# Patient Record
Sex: Male | Born: 1945
Health system: Southern US, Community
[De-identification: ages and names within clinical notes are randomized; demographics above are authoritative.]

## PROBLEM LIST (undated history)

## (undated) DIAGNOSIS — M51369 Other intervertebral disc degeneration, lumbar region without mention of lumbar back pain or lower extremity pain: Secondary | ICD-10-CM

## (undated) DIAGNOSIS — I2510 Atherosclerotic heart disease of native coronary artery without angina pectoris: Secondary | ICD-10-CM

## (undated) DIAGNOSIS — I4891 Unspecified atrial fibrillation: Secondary | ICD-10-CM

## (undated) DIAGNOSIS — L409 Psoriasis, unspecified: Secondary | ICD-10-CM

## (undated) DIAGNOSIS — M5136 Other intervertebral disc degeneration, lumbar region: Secondary | ICD-10-CM

## (undated) HISTORY — DX: Atherosclerotic heart disease of native coronary artery without angina pectoris: I25.10

## (undated) HISTORY — PX: CARDIAC CATHETERIZATION: SHX172

## (undated) HISTORY — DX: Other intervertebral disc degeneration, lumbar region without mention of lumbar back pain or lower extremity pain: M51.369

## (undated) HISTORY — DX: Other intervertebral disc degeneration, lumbar region: M51.36

## (undated) HISTORY — DX: Unspecified atrial fibrillation: I48.91

## (undated) HISTORY — DX: Psoriasis, unspecified: L40.9

---

## 2011-12-28 HISTORY — PX: CORONARY ARTERY BYPASS GRAFT: SHX141

## 2012-04-20 DIAGNOSIS — I2510 Atherosclerotic heart disease of native coronary artery without angina pectoris: Secondary | ICD-10-CM | POA: Diagnosis not present

## 2012-04-20 DIAGNOSIS — R7309 Other abnormal glucose: Secondary | ICD-10-CM | POA: Diagnosis not present

## 2012-04-20 DIAGNOSIS — Z87891 Personal history of nicotine dependence: Secondary | ICD-10-CM | POA: Diagnosis not present

## 2012-04-20 DIAGNOSIS — I200 Unstable angina: Secondary | ICD-10-CM | POA: Diagnosis not present

## 2012-04-20 DIAGNOSIS — R072 Precordial pain: Secondary | ICD-10-CM | POA: Diagnosis not present

## 2012-04-20 DIAGNOSIS — I2589 Other forms of chronic ischemic heart disease: Secondary | ICD-10-CM | POA: Diagnosis not present

## 2012-04-20 DIAGNOSIS — I214 Non-ST elevation (NSTEMI) myocardial infarction: Secondary | ICD-10-CM | POA: Diagnosis not present

## 2012-04-20 DIAGNOSIS — R079 Chest pain, unspecified: Secondary | ICD-10-CM | POA: Diagnosis not present

## 2012-04-21 DIAGNOSIS — I200 Unstable angina: Secondary | ICD-10-CM | POA: Diagnosis not present

## 2012-04-21 DIAGNOSIS — Z452 Encounter for adjustment and management of vascular access device: Secondary | ICD-10-CM | POA: Diagnosis not present

## 2012-04-21 DIAGNOSIS — I214 Non-ST elevation (NSTEMI) myocardial infarction: Secondary | ICD-10-CM | POA: Diagnosis not present

## 2012-04-21 DIAGNOSIS — Z888 Allergy status to other drugs, medicaments and biological substances status: Secondary | ICD-10-CM | POA: Diagnosis not present

## 2012-04-21 DIAGNOSIS — Z87891 Personal history of nicotine dependence: Secondary | ICD-10-CM | POA: Diagnosis not present

## 2012-04-21 DIAGNOSIS — I519 Heart disease, unspecified: Secondary | ICD-10-CM | POA: Diagnosis not present

## 2012-04-21 DIAGNOSIS — I4891 Unspecified atrial fibrillation: Secondary | ICD-10-CM | POA: Diagnosis not present

## 2012-04-21 DIAGNOSIS — I10 Essential (primary) hypertension: Secondary | ICD-10-CM | POA: Diagnosis not present

## 2012-04-21 DIAGNOSIS — Z01818 Encounter for other preprocedural examination: Secondary | ICD-10-CM | POA: Diagnosis not present

## 2012-04-21 DIAGNOSIS — R7309 Other abnormal glucose: Secondary | ICD-10-CM | POA: Diagnosis not present

## 2012-04-21 DIAGNOSIS — Z9889 Other specified postprocedural states: Secondary | ICD-10-CM | POA: Diagnosis not present

## 2012-04-21 DIAGNOSIS — Z885 Allergy status to narcotic agent status: Secondary | ICD-10-CM | POA: Diagnosis not present

## 2012-04-21 DIAGNOSIS — I2589 Other forms of chronic ischemic heart disease: Secondary | ICD-10-CM | POA: Diagnosis not present

## 2012-04-21 DIAGNOSIS — I2510 Atherosclerotic heart disease of native coronary artery without angina pectoris: Secondary | ICD-10-CM | POA: Diagnosis present

## 2012-04-21 DIAGNOSIS — R079 Chest pain, unspecified: Secondary | ICD-10-CM | POA: Diagnosis not present

## 2012-04-21 DIAGNOSIS — J90 Pleural effusion, not elsewhere classified: Secondary | ICD-10-CM | POA: Diagnosis not present

## 2012-04-21 DIAGNOSIS — E785 Hyperlipidemia, unspecified: Secondary | ICD-10-CM | POA: Diagnosis not present

## 2012-04-30 DIAGNOSIS — I10 Essential (primary) hypertension: Secondary | ICD-10-CM | POA: Diagnosis not present

## 2012-04-30 DIAGNOSIS — Z48812 Encounter for surgical aftercare following surgery on the circulatory system: Secondary | ICD-10-CM | POA: Diagnosis not present

## 2012-04-30 DIAGNOSIS — E785 Hyperlipidemia, unspecified: Secondary | ICD-10-CM | POA: Diagnosis not present

## 2012-05-03 DIAGNOSIS — Z48812 Encounter for surgical aftercare following surgery on the circulatory system: Secondary | ICD-10-CM | POA: Diagnosis not present

## 2012-05-03 DIAGNOSIS — E785 Hyperlipidemia, unspecified: Secondary | ICD-10-CM | POA: Diagnosis not present

## 2012-05-03 DIAGNOSIS — I10 Essential (primary) hypertension: Secondary | ICD-10-CM | POA: Diagnosis not present

## 2012-05-05 DIAGNOSIS — I10 Essential (primary) hypertension: Secondary | ICD-10-CM | POA: Diagnosis not present

## 2012-05-05 DIAGNOSIS — E785 Hyperlipidemia, unspecified: Secondary | ICD-10-CM | POA: Diagnosis not present

## 2012-05-05 DIAGNOSIS — Z48812 Encounter for surgical aftercare following surgery on the circulatory system: Secondary | ICD-10-CM | POA: Diagnosis not present

## 2012-05-08 DIAGNOSIS — I10 Essential (primary) hypertension: Secondary | ICD-10-CM | POA: Diagnosis not present

## 2012-05-08 DIAGNOSIS — Z48812 Encounter for surgical aftercare following surgery on the circulatory system: Secondary | ICD-10-CM | POA: Diagnosis not present

## 2012-05-08 DIAGNOSIS — E785 Hyperlipidemia, unspecified: Secondary | ICD-10-CM | POA: Diagnosis not present

## 2012-05-09 DIAGNOSIS — E785 Hyperlipidemia, unspecified: Secondary | ICD-10-CM | POA: Diagnosis not present

## 2012-05-09 DIAGNOSIS — Z48812 Encounter for surgical aftercare following surgery on the circulatory system: Secondary | ICD-10-CM | POA: Diagnosis not present

## 2012-05-09 DIAGNOSIS — I10 Essential (primary) hypertension: Secondary | ICD-10-CM | POA: Diagnosis not present

## 2012-05-12 DIAGNOSIS — I10 Essential (primary) hypertension: Secondary | ICD-10-CM | POA: Diagnosis not present

## 2012-05-12 DIAGNOSIS — Z48812 Encounter for surgical aftercare following surgery on the circulatory system: Secondary | ICD-10-CM | POA: Diagnosis not present

## 2012-05-12 DIAGNOSIS — E785 Hyperlipidemia, unspecified: Secondary | ICD-10-CM | POA: Diagnosis not present

## 2012-05-17 DIAGNOSIS — Z48812 Encounter for surgical aftercare following surgery on the circulatory system: Secondary | ICD-10-CM | POA: Diagnosis not present

## 2012-05-17 DIAGNOSIS — E785 Hyperlipidemia, unspecified: Secondary | ICD-10-CM | POA: Diagnosis not present

## 2012-05-17 DIAGNOSIS — I10 Essential (primary) hypertension: Secondary | ICD-10-CM | POA: Diagnosis not present

## 2012-05-25 DIAGNOSIS — Z951 Presence of aortocoronary bypass graft: Secondary | ICD-10-CM | POA: Insufficient documentation

## 2012-06-05 DIAGNOSIS — E785 Hyperlipidemia, unspecified: Secondary | ICD-10-CM | POA: Diagnosis not present

## 2012-06-05 DIAGNOSIS — I2510 Atherosclerotic heart disease of native coronary artery without angina pectoris: Secondary | ICD-10-CM | POA: Diagnosis not present

## 2012-07-14 DIAGNOSIS — R0789 Other chest pain: Secondary | ICD-10-CM | POA: Diagnosis not present

## 2012-07-14 DIAGNOSIS — Z951 Presence of aortocoronary bypass graft: Secondary | ICD-10-CM | POA: Diagnosis not present

## 2012-07-19 DIAGNOSIS — Z951 Presence of aortocoronary bypass graft: Secondary | ICD-10-CM | POA: Diagnosis not present

## 2012-09-26 DIAGNOSIS — E785 Hyperlipidemia, unspecified: Secondary | ICD-10-CM | POA: Diagnosis not present

## 2012-09-26 DIAGNOSIS — I2510 Atherosclerotic heart disease of native coronary artery without angina pectoris: Secondary | ICD-10-CM | POA: Diagnosis not present

## 2012-10-02 DIAGNOSIS — I2510 Atherosclerotic heart disease of native coronary artery without angina pectoris: Secondary | ICD-10-CM | POA: Diagnosis not present

## 2012-10-12 DIAGNOSIS — IMO0002 Reserved for concepts with insufficient information to code with codable children: Secondary | ICD-10-CM | POA: Diagnosis not present

## 2012-10-12 DIAGNOSIS — T819XXA Unspecified complication of procedure, initial encounter: Secondary | ICD-10-CM | POA: Insufficient documentation

## 2012-10-19 DIAGNOSIS — I498 Other specified cardiac arrhythmias: Secondary | ICD-10-CM | POA: Diagnosis not present

## 2012-10-19 DIAGNOSIS — I25810 Atherosclerosis of coronary artery bypass graft(s) without angina pectoris: Secondary | ICD-10-CM | POA: Diagnosis not present

## 2012-10-19 DIAGNOSIS — R079 Chest pain, unspecified: Secondary | ICD-10-CM | POA: Diagnosis not present

## 2012-12-25 DIAGNOSIS — B359 Dermatophytosis, unspecified: Secondary | ICD-10-CM | POA: Diagnosis not present

## 2012-12-25 DIAGNOSIS — Z85828 Personal history of other malignant neoplasm of skin: Secondary | ICD-10-CM | POA: Diagnosis not present

## 2012-12-25 DIAGNOSIS — L570 Actinic keratosis: Secondary | ICD-10-CM | POA: Diagnosis not present

## 2013-01-18 DIAGNOSIS — R42 Dizziness and giddiness: Secondary | ICD-10-CM | POA: Diagnosis not present

## 2013-01-18 DIAGNOSIS — T82897A Other specified complication of cardiac prosthetic devices, implants and grafts, initial encounter: Secondary | ICD-10-CM | POA: Diagnosis not present

## 2013-01-18 DIAGNOSIS — IMO0002 Reserved for concepts with insufficient information to code with codable children: Secondary | ICD-10-CM | POA: Diagnosis not present

## 2013-01-18 DIAGNOSIS — R031 Nonspecific low blood-pressure reading: Secondary | ICD-10-CM | POA: Diagnosis not present

## 2013-01-18 DIAGNOSIS — I2510 Atherosclerotic heart disease of native coronary artery without angina pectoris: Secondary | ICD-10-CM | POA: Diagnosis not present

## 2013-01-18 DIAGNOSIS — E785 Hyperlipidemia, unspecified: Secondary | ICD-10-CM | POA: Diagnosis not present

## 2013-01-25 DIAGNOSIS — Z951 Presence of aortocoronary bypass graft: Secondary | ICD-10-CM | POA: Diagnosis not present

## 2013-01-30 DIAGNOSIS — I2119 ST elevation (STEMI) myocardial infarction involving other coronary artery of inferior wall: Secondary | ICD-10-CM | POA: Diagnosis not present

## 2013-01-30 DIAGNOSIS — Z951 Presence of aortocoronary bypass graft: Secondary | ICD-10-CM | POA: Diagnosis not present

## 2013-01-30 DIAGNOSIS — L570 Actinic keratosis: Secondary | ICD-10-CM | POA: Diagnosis not present

## 2013-01-30 DIAGNOSIS — I10 Essential (primary) hypertension: Secondary | ICD-10-CM | POA: Diagnosis not present

## 2013-01-30 DIAGNOSIS — I2510 Atherosclerotic heart disease of native coronary artery without angina pectoris: Secondary | ICD-10-CM | POA: Diagnosis not present

## 2013-01-30 DIAGNOSIS — R072 Precordial pain: Secondary | ICD-10-CM | POA: Diagnosis not present

## 2013-01-30 DIAGNOSIS — B078 Other viral warts: Secondary | ICD-10-CM | POA: Diagnosis not present

## 2013-01-30 DIAGNOSIS — I252 Old myocardial infarction: Secondary | ICD-10-CM | POA: Diagnosis not present

## 2013-01-30 DIAGNOSIS — E785 Hyperlipidemia, unspecified: Secondary | ICD-10-CM | POA: Diagnosis not present

## 2013-01-30 DIAGNOSIS — L280 Lichen simplex chronicus: Secondary | ICD-10-CM | POA: Diagnosis not present

## 2013-01-30 DIAGNOSIS — T85698A Other mechanical complication of other specified internal prosthetic devices, implants and grafts, initial encounter: Secondary | ICD-10-CM | POA: Diagnosis not present

## 2013-01-30 DIAGNOSIS — I2589 Other forms of chronic ischemic heart disease: Secondary | ICD-10-CM | POA: Diagnosis not present

## 2013-01-31 DIAGNOSIS — R072 Precordial pain: Secondary | ICD-10-CM | POA: Diagnosis not present

## 2013-01-31 DIAGNOSIS — L280 Lichen simplex chronicus: Secondary | ICD-10-CM | POA: Diagnosis not present

## 2013-01-31 DIAGNOSIS — Z472 Encounter for removal of internal fixation device: Secondary | ICD-10-CM | POA: Diagnosis not present

## 2013-01-31 DIAGNOSIS — I2510 Atherosclerotic heart disease of native coronary artery without angina pectoris: Secondary | ICD-10-CM | POA: Diagnosis not present

## 2013-01-31 DIAGNOSIS — T85698A Other mechanical complication of other specified internal prosthetic devices, implants and grafts, initial encounter: Secondary | ICD-10-CM | POA: Diagnosis not present

## 2013-01-31 DIAGNOSIS — B078 Other viral warts: Secondary | ICD-10-CM | POA: Diagnosis not present

## 2013-01-31 DIAGNOSIS — L570 Actinic keratosis: Secondary | ICD-10-CM | POA: Diagnosis not present

## 2013-02-01 DIAGNOSIS — R072 Precordial pain: Secondary | ICD-10-CM | POA: Diagnosis not present

## 2013-02-01 DIAGNOSIS — L280 Lichen simplex chronicus: Secondary | ICD-10-CM | POA: Diagnosis not present

## 2013-02-01 DIAGNOSIS — L570 Actinic keratosis: Secondary | ICD-10-CM | POA: Diagnosis not present

## 2013-02-01 DIAGNOSIS — T85698A Other mechanical complication of other specified internal prosthetic devices, implants and grafts, initial encounter: Secondary | ICD-10-CM | POA: Diagnosis not present

## 2013-02-01 DIAGNOSIS — I2510 Atherosclerotic heart disease of native coronary artery without angina pectoris: Secondary | ICD-10-CM | POA: Diagnosis not present

## 2013-02-01 DIAGNOSIS — B078 Other viral warts: Secondary | ICD-10-CM | POA: Diagnosis not present

## 2013-02-22 DIAGNOSIS — IMO0002 Reserved for concepts with insufficient information to code with codable children: Secondary | ICD-10-CM | POA: Diagnosis not present

## 2013-02-26 DIAGNOSIS — Z09 Encounter for follow-up examination after completed treatment for conditions other than malignant neoplasm: Secondary | ICD-10-CM | POA: Diagnosis not present

## 2013-02-26 DIAGNOSIS — T82190A Other mechanical complication of cardiac electrode, initial encounter: Secondary | ICD-10-CM | POA: Diagnosis not present

## 2013-04-09 DIAGNOSIS — L301 Dyshidrosis [pompholyx]: Secondary | ICD-10-CM | POA: Diagnosis not present

## 2013-04-09 DIAGNOSIS — L738 Other specified follicular disorders: Secondary | ICD-10-CM | POA: Diagnosis not present

## 2013-04-09 DIAGNOSIS — L299 Pruritus, unspecified: Secondary | ICD-10-CM | POA: Diagnosis not present

## 2013-04-09 DIAGNOSIS — IMO0002 Reserved for concepts with insufficient information to code with codable children: Secondary | ICD-10-CM | POA: Diagnosis not present

## 2013-04-09 DIAGNOSIS — L578 Other skin changes due to chronic exposure to nonionizing radiation: Secondary | ICD-10-CM | POA: Diagnosis not present

## 2013-04-26 DIAGNOSIS — I2510 Atherosclerotic heart disease of native coronary artery without angina pectoris: Secondary | ICD-10-CM | POA: Diagnosis not present

## 2013-04-26 DIAGNOSIS — I209 Angina pectoris, unspecified: Secondary | ICD-10-CM | POA: Diagnosis not present

## 2013-05-04 DIAGNOSIS — Z7982 Long term (current) use of aspirin: Secondary | ICD-10-CM | POA: Diagnosis not present

## 2013-05-04 DIAGNOSIS — I10 Essential (primary) hypertension: Secondary | ICD-10-CM | POA: Diagnosis not present

## 2013-05-04 DIAGNOSIS — L259 Unspecified contact dermatitis, unspecified cause: Secondary | ICD-10-CM | POA: Diagnosis not present

## 2013-05-04 DIAGNOSIS — Z951 Presence of aortocoronary bypass graft: Secondary | ICD-10-CM | POA: Diagnosis not present

## 2013-05-04 DIAGNOSIS — R791 Abnormal coagulation profile: Secondary | ICD-10-CM | POA: Diagnosis not present

## 2013-05-04 DIAGNOSIS — L408 Other psoriasis: Secondary | ICD-10-CM | POA: Diagnosis not present

## 2013-05-04 DIAGNOSIS — I2510 Atherosclerotic heart disease of native coronary artery without angina pectoris: Secondary | ICD-10-CM | POA: Diagnosis not present

## 2013-05-04 DIAGNOSIS — I491 Atrial premature depolarization: Secondary | ICD-10-CM | POA: Diagnosis not present

## 2013-05-04 DIAGNOSIS — Z87891 Personal history of nicotine dependence: Secondary | ICD-10-CM | POA: Diagnosis not present

## 2013-05-04 DIAGNOSIS — R079 Chest pain, unspecified: Secondary | ICD-10-CM | POA: Diagnosis not present

## 2013-05-04 DIAGNOSIS — E785 Hyperlipidemia, unspecified: Secondary | ICD-10-CM | POA: Diagnosis not present

## 2013-05-05 DIAGNOSIS — L408 Other psoriasis: Secondary | ICD-10-CM | POA: Diagnosis not present

## 2013-05-05 DIAGNOSIS — I2510 Atherosclerotic heart disease of native coronary artery without angina pectoris: Secondary | ICD-10-CM | POA: Diagnosis not present

## 2013-05-05 DIAGNOSIS — E785 Hyperlipidemia, unspecified: Secondary | ICD-10-CM | POA: Diagnosis not present

## 2013-05-05 DIAGNOSIS — I10 Essential (primary) hypertension: Secondary | ICD-10-CM | POA: Diagnosis not present

## 2013-05-05 DIAGNOSIS — Z951 Presence of aortocoronary bypass graft: Secondary | ICD-10-CM | POA: Diagnosis not present

## 2013-05-05 DIAGNOSIS — L259 Unspecified contact dermatitis, unspecified cause: Secondary | ICD-10-CM | POA: Diagnosis not present

## 2013-05-10 DIAGNOSIS — I2510 Atherosclerotic heart disease of native coronary artery without angina pectoris: Secondary | ICD-10-CM | POA: Diagnosis not present

## 2013-05-10 DIAGNOSIS — Z48812 Encounter for surgical aftercare following surgery on the circulatory system: Secondary | ICD-10-CM | POA: Diagnosis not present

## 2013-05-10 DIAGNOSIS — Z951 Presence of aortocoronary bypass graft: Secondary | ICD-10-CM | POA: Diagnosis not present

## 2013-05-28 DIAGNOSIS — M47817 Spondylosis without myelopathy or radiculopathy, lumbosacral region: Secondary | ICD-10-CM | POA: Diagnosis not present

## 2013-05-28 DIAGNOSIS — M999 Biomechanical lesion, unspecified: Secondary | ICD-10-CM | POA: Diagnosis not present

## 2013-05-28 DIAGNOSIS — M9981 Other biomechanical lesions of cervical region: Secondary | ICD-10-CM | POA: Diagnosis not present

## 2013-05-28 DIAGNOSIS — M47812 Spondylosis without myelopathy or radiculopathy, cervical region: Secondary | ICD-10-CM | POA: Diagnosis not present

## 2013-06-01 DIAGNOSIS — M9981 Other biomechanical lesions of cervical region: Secondary | ICD-10-CM | POA: Diagnosis not present

## 2013-06-01 DIAGNOSIS — M47812 Spondylosis without myelopathy or radiculopathy, cervical region: Secondary | ICD-10-CM | POA: Diagnosis not present

## 2013-06-01 DIAGNOSIS — M999 Biomechanical lesion, unspecified: Secondary | ICD-10-CM | POA: Diagnosis not present

## 2013-06-01 DIAGNOSIS — M47817 Spondylosis without myelopathy or radiculopathy, lumbosacral region: Secondary | ICD-10-CM | POA: Diagnosis not present

## 2013-06-04 DIAGNOSIS — M9981 Other biomechanical lesions of cervical region: Secondary | ICD-10-CM | POA: Diagnosis not present

## 2013-06-04 DIAGNOSIS — M47812 Spondylosis without myelopathy or radiculopathy, cervical region: Secondary | ICD-10-CM | POA: Diagnosis not present

## 2013-06-04 DIAGNOSIS — M47817 Spondylosis without myelopathy or radiculopathy, lumbosacral region: Secondary | ICD-10-CM | POA: Diagnosis not present

## 2013-06-04 DIAGNOSIS — M999 Biomechanical lesion, unspecified: Secondary | ICD-10-CM | POA: Diagnosis not present

## 2013-06-08 DIAGNOSIS — M999 Biomechanical lesion, unspecified: Secondary | ICD-10-CM | POA: Diagnosis not present

## 2013-06-08 DIAGNOSIS — M9981 Other biomechanical lesions of cervical region: Secondary | ICD-10-CM | POA: Diagnosis not present

## 2013-06-08 DIAGNOSIS — M47817 Spondylosis without myelopathy or radiculopathy, lumbosacral region: Secondary | ICD-10-CM | POA: Diagnosis not present

## 2013-06-08 DIAGNOSIS — M47812 Spondylosis without myelopathy or radiculopathy, cervical region: Secondary | ICD-10-CM | POA: Diagnosis not present

## 2013-06-11 DIAGNOSIS — M47817 Spondylosis without myelopathy or radiculopathy, lumbosacral region: Secondary | ICD-10-CM | POA: Diagnosis not present

## 2013-06-11 DIAGNOSIS — M9981 Other biomechanical lesions of cervical region: Secondary | ICD-10-CM | POA: Diagnosis not present

## 2013-06-11 DIAGNOSIS — M999 Biomechanical lesion, unspecified: Secondary | ICD-10-CM | POA: Diagnosis not present

## 2013-06-11 DIAGNOSIS — M47812 Spondylosis without myelopathy or radiculopathy, cervical region: Secondary | ICD-10-CM | POA: Diagnosis not present

## 2013-06-13 DIAGNOSIS — M47812 Spondylosis without myelopathy or radiculopathy, cervical region: Secondary | ICD-10-CM | POA: Diagnosis not present

## 2013-06-13 DIAGNOSIS — M999 Biomechanical lesion, unspecified: Secondary | ICD-10-CM | POA: Diagnosis not present

## 2013-06-13 DIAGNOSIS — M9981 Other biomechanical lesions of cervical region: Secondary | ICD-10-CM | POA: Diagnosis not present

## 2013-06-13 DIAGNOSIS — M47817 Spondylosis without myelopathy or radiculopathy, lumbosacral region: Secondary | ICD-10-CM | POA: Diagnosis not present

## 2013-06-18 DIAGNOSIS — M9981 Other biomechanical lesions of cervical region: Secondary | ICD-10-CM | POA: Diagnosis not present

## 2013-06-18 DIAGNOSIS — M47812 Spondylosis without myelopathy or radiculopathy, cervical region: Secondary | ICD-10-CM | POA: Diagnosis not present

## 2013-06-18 DIAGNOSIS — M999 Biomechanical lesion, unspecified: Secondary | ICD-10-CM | POA: Diagnosis not present

## 2013-06-18 DIAGNOSIS — M47817 Spondylosis without myelopathy or radiculopathy, lumbosacral region: Secondary | ICD-10-CM | POA: Diagnosis not present

## 2013-06-22 DIAGNOSIS — M999 Biomechanical lesion, unspecified: Secondary | ICD-10-CM | POA: Diagnosis not present

## 2013-06-22 DIAGNOSIS — M47812 Spondylosis without myelopathy or radiculopathy, cervical region: Secondary | ICD-10-CM | POA: Diagnosis not present

## 2013-06-22 DIAGNOSIS — M47817 Spondylosis without myelopathy or radiculopathy, lumbosacral region: Secondary | ICD-10-CM | POA: Diagnosis not present

## 2013-06-22 DIAGNOSIS — M9981 Other biomechanical lesions of cervical region: Secondary | ICD-10-CM | POA: Diagnosis not present

## 2013-06-25 DIAGNOSIS — M47812 Spondylosis without myelopathy or radiculopathy, cervical region: Secondary | ICD-10-CM | POA: Diagnosis not present

## 2013-06-25 DIAGNOSIS — M47817 Spondylosis without myelopathy or radiculopathy, lumbosacral region: Secondary | ICD-10-CM | POA: Diagnosis not present

## 2013-06-25 DIAGNOSIS — M999 Biomechanical lesion, unspecified: Secondary | ICD-10-CM | POA: Diagnosis not present

## 2013-06-25 DIAGNOSIS — M9981 Other biomechanical lesions of cervical region: Secondary | ICD-10-CM | POA: Diagnosis not present

## 2013-06-27 DIAGNOSIS — M9981 Other biomechanical lesions of cervical region: Secondary | ICD-10-CM | POA: Diagnosis not present

## 2013-06-27 DIAGNOSIS — M999 Biomechanical lesion, unspecified: Secondary | ICD-10-CM | POA: Diagnosis not present

## 2013-06-27 DIAGNOSIS — M47812 Spondylosis without myelopathy or radiculopathy, cervical region: Secondary | ICD-10-CM | POA: Diagnosis not present

## 2013-06-27 DIAGNOSIS — M47817 Spondylosis without myelopathy or radiculopathy, lumbosacral region: Secondary | ICD-10-CM | POA: Diagnosis not present

## 2013-06-28 DIAGNOSIS — I2510 Atherosclerotic heart disease of native coronary artery without angina pectoris: Secondary | ICD-10-CM | POA: Diagnosis not present

## 2013-06-28 DIAGNOSIS — Z951 Presence of aortocoronary bypass graft: Secondary | ICD-10-CM | POA: Diagnosis not present

## 2013-07-02 DIAGNOSIS — M9981 Other biomechanical lesions of cervical region: Secondary | ICD-10-CM | POA: Diagnosis not present

## 2013-07-02 DIAGNOSIS — M47817 Spondylosis without myelopathy or radiculopathy, lumbosacral region: Secondary | ICD-10-CM | POA: Diagnosis not present

## 2013-07-02 DIAGNOSIS — M999 Biomechanical lesion, unspecified: Secondary | ICD-10-CM | POA: Diagnosis not present

## 2013-07-02 DIAGNOSIS — M47812 Spondylosis without myelopathy or radiculopathy, cervical region: Secondary | ICD-10-CM | POA: Diagnosis not present

## 2013-07-06 DIAGNOSIS — M9981 Other biomechanical lesions of cervical region: Secondary | ICD-10-CM | POA: Diagnosis not present

## 2013-07-06 DIAGNOSIS — M47812 Spondylosis without myelopathy or radiculopathy, cervical region: Secondary | ICD-10-CM | POA: Diagnosis not present

## 2013-07-06 DIAGNOSIS — M999 Biomechanical lesion, unspecified: Secondary | ICD-10-CM | POA: Diagnosis not present

## 2013-07-06 DIAGNOSIS — M47817 Spondylosis without myelopathy or radiculopathy, lumbosacral region: Secondary | ICD-10-CM | POA: Diagnosis not present

## 2013-07-09 DIAGNOSIS — M47812 Spondylosis without myelopathy or radiculopathy, cervical region: Secondary | ICD-10-CM | POA: Diagnosis not present

## 2013-07-09 DIAGNOSIS — M47817 Spondylosis without myelopathy or radiculopathy, lumbosacral region: Secondary | ICD-10-CM | POA: Diagnosis not present

## 2013-07-09 DIAGNOSIS — M999 Biomechanical lesion, unspecified: Secondary | ICD-10-CM | POA: Diagnosis not present

## 2013-07-09 DIAGNOSIS — M9981 Other biomechanical lesions of cervical region: Secondary | ICD-10-CM | POA: Diagnosis not present

## 2013-07-13 DIAGNOSIS — M999 Biomechanical lesion, unspecified: Secondary | ICD-10-CM | POA: Diagnosis not present

## 2013-07-13 DIAGNOSIS — M9981 Other biomechanical lesions of cervical region: Secondary | ICD-10-CM | POA: Diagnosis not present

## 2013-07-13 DIAGNOSIS — M47817 Spondylosis without myelopathy or radiculopathy, lumbosacral region: Secondary | ICD-10-CM | POA: Diagnosis not present

## 2013-07-13 DIAGNOSIS — M47812 Spondylosis without myelopathy or radiculopathy, cervical region: Secondary | ICD-10-CM | POA: Diagnosis not present

## 2013-07-16 DIAGNOSIS — M999 Biomechanical lesion, unspecified: Secondary | ICD-10-CM | POA: Diagnosis not present

## 2013-07-16 DIAGNOSIS — M47817 Spondylosis without myelopathy or radiculopathy, lumbosacral region: Secondary | ICD-10-CM | POA: Diagnosis not present

## 2013-07-16 DIAGNOSIS — M47812 Spondylosis without myelopathy or radiculopathy, cervical region: Secondary | ICD-10-CM | POA: Diagnosis not present

## 2013-07-16 DIAGNOSIS — M9981 Other biomechanical lesions of cervical region: Secondary | ICD-10-CM | POA: Diagnosis not present

## 2013-07-20 DIAGNOSIS — M47812 Spondylosis without myelopathy or radiculopathy, cervical region: Secondary | ICD-10-CM | POA: Diagnosis not present

## 2013-07-20 DIAGNOSIS — M999 Biomechanical lesion, unspecified: Secondary | ICD-10-CM | POA: Diagnosis not present

## 2013-07-20 DIAGNOSIS — M47817 Spondylosis without myelopathy or radiculopathy, lumbosacral region: Secondary | ICD-10-CM | POA: Diagnosis not present

## 2013-07-20 DIAGNOSIS — M9981 Other biomechanical lesions of cervical region: Secondary | ICD-10-CM | POA: Diagnosis not present

## 2013-07-23 DIAGNOSIS — M47817 Spondylosis without myelopathy or radiculopathy, lumbosacral region: Secondary | ICD-10-CM | POA: Diagnosis not present

## 2013-07-23 DIAGNOSIS — M999 Biomechanical lesion, unspecified: Secondary | ICD-10-CM | POA: Diagnosis not present

## 2013-07-23 DIAGNOSIS — M9981 Other biomechanical lesions of cervical region: Secondary | ICD-10-CM | POA: Diagnosis not present

## 2013-07-23 DIAGNOSIS — M47812 Spondylosis without myelopathy or radiculopathy, cervical region: Secondary | ICD-10-CM | POA: Diagnosis not present

## 2013-07-30 DIAGNOSIS — M47812 Spondylosis without myelopathy or radiculopathy, cervical region: Secondary | ICD-10-CM | POA: Diagnosis not present

## 2013-07-30 DIAGNOSIS — M47817 Spondylosis without myelopathy or radiculopathy, lumbosacral region: Secondary | ICD-10-CM | POA: Diagnosis not present

## 2013-07-30 DIAGNOSIS — M999 Biomechanical lesion, unspecified: Secondary | ICD-10-CM | POA: Diagnosis not present

## 2013-07-30 DIAGNOSIS — M9981 Other biomechanical lesions of cervical region: Secondary | ICD-10-CM | POA: Diagnosis not present

## 2013-08-03 DIAGNOSIS — M47812 Spondylosis without myelopathy or radiculopathy, cervical region: Secondary | ICD-10-CM | POA: Diagnosis not present

## 2013-08-03 DIAGNOSIS — M9981 Other biomechanical lesions of cervical region: Secondary | ICD-10-CM | POA: Diagnosis not present

## 2013-08-03 DIAGNOSIS — M999 Biomechanical lesion, unspecified: Secondary | ICD-10-CM | POA: Diagnosis not present

## 2013-08-03 DIAGNOSIS — M47817 Spondylosis without myelopathy or radiculopathy, lumbosacral region: Secondary | ICD-10-CM | POA: Diagnosis not present

## 2013-08-08 DIAGNOSIS — M999 Biomechanical lesion, unspecified: Secondary | ICD-10-CM | POA: Diagnosis not present

## 2013-08-08 DIAGNOSIS — M9981 Other biomechanical lesions of cervical region: Secondary | ICD-10-CM | POA: Diagnosis not present

## 2013-08-08 DIAGNOSIS — M47812 Spondylosis without myelopathy or radiculopathy, cervical region: Secondary | ICD-10-CM | POA: Diagnosis not present

## 2013-08-08 DIAGNOSIS — M47817 Spondylosis without myelopathy or radiculopathy, lumbosacral region: Secondary | ICD-10-CM | POA: Diagnosis not present

## 2013-08-13 DIAGNOSIS — M5430 Sciatica, unspecified side: Secondary | ICD-10-CM | POA: Diagnosis not present

## 2013-08-13 DIAGNOSIS — M999 Biomechanical lesion, unspecified: Secondary | ICD-10-CM | POA: Diagnosis not present

## 2013-08-13 DIAGNOSIS — M47812 Spondylosis without myelopathy or radiculopathy, cervical region: Secondary | ICD-10-CM | POA: Diagnosis not present

## 2013-08-13 DIAGNOSIS — M9981 Other biomechanical lesions of cervical region: Secondary | ICD-10-CM | POA: Diagnosis not present

## 2013-08-16 DIAGNOSIS — M47812 Spondylosis without myelopathy or radiculopathy, cervical region: Secondary | ICD-10-CM | POA: Diagnosis not present

## 2013-08-16 DIAGNOSIS — M9981 Other biomechanical lesions of cervical region: Secondary | ICD-10-CM | POA: Diagnosis not present

## 2013-08-16 DIAGNOSIS — M999 Biomechanical lesion, unspecified: Secondary | ICD-10-CM | POA: Diagnosis not present

## 2013-08-16 DIAGNOSIS — M5430 Sciatica, unspecified side: Secondary | ICD-10-CM | POA: Diagnosis not present

## 2013-08-21 DIAGNOSIS — M9981 Other biomechanical lesions of cervical region: Secondary | ICD-10-CM | POA: Diagnosis not present

## 2013-08-21 DIAGNOSIS — M5430 Sciatica, unspecified side: Secondary | ICD-10-CM | POA: Diagnosis not present

## 2013-08-21 DIAGNOSIS — M999 Biomechanical lesion, unspecified: Secondary | ICD-10-CM | POA: Diagnosis not present

## 2013-08-21 DIAGNOSIS — M47812 Spondylosis without myelopathy or radiculopathy, cervical region: Secondary | ICD-10-CM | POA: Diagnosis not present

## 2013-08-24 DIAGNOSIS — M9981 Other biomechanical lesions of cervical region: Secondary | ICD-10-CM | POA: Diagnosis not present

## 2013-08-24 DIAGNOSIS — M47812 Spondylosis without myelopathy or radiculopathy, cervical region: Secondary | ICD-10-CM | POA: Diagnosis not present

## 2013-08-24 DIAGNOSIS — M999 Biomechanical lesion, unspecified: Secondary | ICD-10-CM | POA: Diagnosis not present

## 2013-08-24 DIAGNOSIS — M5430 Sciatica, unspecified side: Secondary | ICD-10-CM | POA: Diagnosis not present

## 2013-08-28 DIAGNOSIS — M5430 Sciatica, unspecified side: Secondary | ICD-10-CM | POA: Diagnosis not present

## 2013-08-28 DIAGNOSIS — M999 Biomechanical lesion, unspecified: Secondary | ICD-10-CM | POA: Diagnosis not present

## 2013-08-28 DIAGNOSIS — M47812 Spondylosis without myelopathy or radiculopathy, cervical region: Secondary | ICD-10-CM | POA: Diagnosis not present

## 2013-08-28 DIAGNOSIS — M9981 Other biomechanical lesions of cervical region: Secondary | ICD-10-CM | POA: Diagnosis not present

## 2013-08-31 DIAGNOSIS — M5430 Sciatica, unspecified side: Secondary | ICD-10-CM | POA: Diagnosis not present

## 2013-08-31 DIAGNOSIS — M9981 Other biomechanical lesions of cervical region: Secondary | ICD-10-CM | POA: Diagnosis not present

## 2013-08-31 DIAGNOSIS — M999 Biomechanical lesion, unspecified: Secondary | ICD-10-CM | POA: Diagnosis not present

## 2013-08-31 DIAGNOSIS — M47812 Spondylosis without myelopathy or radiculopathy, cervical region: Secondary | ICD-10-CM | POA: Diagnosis not present

## 2013-09-03 DIAGNOSIS — M47812 Spondylosis without myelopathy or radiculopathy, cervical region: Secondary | ICD-10-CM | POA: Diagnosis not present

## 2013-09-03 DIAGNOSIS — M5430 Sciatica, unspecified side: Secondary | ICD-10-CM | POA: Diagnosis not present

## 2013-09-03 DIAGNOSIS — M9981 Other biomechanical lesions of cervical region: Secondary | ICD-10-CM | POA: Diagnosis not present

## 2013-09-03 DIAGNOSIS — M999 Biomechanical lesion, unspecified: Secondary | ICD-10-CM | POA: Diagnosis not present

## 2013-09-07 DIAGNOSIS — M5430 Sciatica, unspecified side: Secondary | ICD-10-CM | POA: Diagnosis not present

## 2013-09-07 DIAGNOSIS — M999 Biomechanical lesion, unspecified: Secondary | ICD-10-CM | POA: Diagnosis not present

## 2013-09-07 DIAGNOSIS — M47812 Spondylosis without myelopathy or radiculopathy, cervical region: Secondary | ICD-10-CM | POA: Diagnosis not present

## 2013-09-07 DIAGNOSIS — M9981 Other biomechanical lesions of cervical region: Secondary | ICD-10-CM | POA: Diagnosis not present

## 2013-09-10 DIAGNOSIS — M47812 Spondylosis without myelopathy or radiculopathy, cervical region: Secondary | ICD-10-CM | POA: Diagnosis not present

## 2013-09-10 DIAGNOSIS — M999 Biomechanical lesion, unspecified: Secondary | ICD-10-CM | POA: Diagnosis not present

## 2013-09-10 DIAGNOSIS — M9981 Other biomechanical lesions of cervical region: Secondary | ICD-10-CM | POA: Diagnosis not present

## 2013-09-10 DIAGNOSIS — M5430 Sciatica, unspecified side: Secondary | ICD-10-CM | POA: Diagnosis not present

## 2013-09-17 DIAGNOSIS — M9981 Other biomechanical lesions of cervical region: Secondary | ICD-10-CM | POA: Diagnosis not present

## 2013-09-17 DIAGNOSIS — M999 Biomechanical lesion, unspecified: Secondary | ICD-10-CM | POA: Diagnosis not present

## 2013-09-17 DIAGNOSIS — M47812 Spondylosis without myelopathy or radiculopathy, cervical region: Secondary | ICD-10-CM | POA: Diagnosis not present

## 2013-09-17 DIAGNOSIS — M5430 Sciatica, unspecified side: Secondary | ICD-10-CM | POA: Diagnosis not present

## 2013-09-21 DIAGNOSIS — M47812 Spondylosis without myelopathy or radiculopathy, cervical region: Secondary | ICD-10-CM | POA: Diagnosis not present

## 2013-09-21 DIAGNOSIS — M5430 Sciatica, unspecified side: Secondary | ICD-10-CM | POA: Diagnosis not present

## 2013-09-21 DIAGNOSIS — M9981 Other biomechanical lesions of cervical region: Secondary | ICD-10-CM | POA: Diagnosis not present

## 2013-09-21 DIAGNOSIS — M999 Biomechanical lesion, unspecified: Secondary | ICD-10-CM | POA: Diagnosis not present

## 2013-09-26 DIAGNOSIS — M9981 Other biomechanical lesions of cervical region: Secondary | ICD-10-CM | POA: Diagnosis not present

## 2013-09-26 DIAGNOSIS — M999 Biomechanical lesion, unspecified: Secondary | ICD-10-CM | POA: Diagnosis not present

## 2013-09-26 DIAGNOSIS — M5430 Sciatica, unspecified side: Secondary | ICD-10-CM | POA: Diagnosis not present

## 2013-09-26 DIAGNOSIS — M47812 Spondylosis without myelopathy or radiculopathy, cervical region: Secondary | ICD-10-CM | POA: Diagnosis not present

## 2013-09-28 DIAGNOSIS — M9981 Other biomechanical lesions of cervical region: Secondary | ICD-10-CM | POA: Diagnosis not present

## 2013-09-28 DIAGNOSIS — M5430 Sciatica, unspecified side: Secondary | ICD-10-CM | POA: Diagnosis not present

## 2013-09-28 DIAGNOSIS — M47812 Spondylosis without myelopathy or radiculopathy, cervical region: Secondary | ICD-10-CM | POA: Diagnosis not present

## 2013-09-28 DIAGNOSIS — M999 Biomechanical lesion, unspecified: Secondary | ICD-10-CM | POA: Diagnosis not present

## 2013-10-05 DIAGNOSIS — M9981 Other biomechanical lesions of cervical region: Secondary | ICD-10-CM | POA: Diagnosis not present

## 2013-10-05 DIAGNOSIS — M5430 Sciatica, unspecified side: Secondary | ICD-10-CM | POA: Diagnosis not present

## 2013-10-05 DIAGNOSIS — M47812 Spondylosis without myelopathy or radiculopathy, cervical region: Secondary | ICD-10-CM | POA: Diagnosis not present

## 2013-10-05 DIAGNOSIS — M999 Biomechanical lesion, unspecified: Secondary | ICD-10-CM | POA: Diagnosis not present

## 2013-10-08 DIAGNOSIS — M5430 Sciatica, unspecified side: Secondary | ICD-10-CM | POA: Diagnosis not present

## 2013-10-08 DIAGNOSIS — M9981 Other biomechanical lesions of cervical region: Secondary | ICD-10-CM | POA: Diagnosis not present

## 2013-10-08 DIAGNOSIS — M999 Biomechanical lesion, unspecified: Secondary | ICD-10-CM | POA: Diagnosis not present

## 2013-10-08 DIAGNOSIS — M531 Cervicobrachial syndrome: Secondary | ICD-10-CM | POA: Diagnosis not present

## 2013-10-12 DIAGNOSIS — M9981 Other biomechanical lesions of cervical region: Secondary | ICD-10-CM | POA: Diagnosis not present

## 2013-10-12 DIAGNOSIS — M5430 Sciatica, unspecified side: Secondary | ICD-10-CM | POA: Diagnosis not present

## 2013-10-12 DIAGNOSIS — M531 Cervicobrachial syndrome: Secondary | ICD-10-CM | POA: Diagnosis not present

## 2013-10-12 DIAGNOSIS — M999 Biomechanical lesion, unspecified: Secondary | ICD-10-CM | POA: Diagnosis not present

## 2013-10-15 DIAGNOSIS — M5430 Sciatica, unspecified side: Secondary | ICD-10-CM | POA: Diagnosis not present

## 2013-10-15 DIAGNOSIS — M999 Biomechanical lesion, unspecified: Secondary | ICD-10-CM | POA: Diagnosis not present

## 2013-10-15 DIAGNOSIS — M9981 Other biomechanical lesions of cervical region: Secondary | ICD-10-CM | POA: Diagnosis not present

## 2013-10-15 DIAGNOSIS — M531 Cervicobrachial syndrome: Secondary | ICD-10-CM | POA: Diagnosis not present

## 2013-10-19 DIAGNOSIS — M9981 Other biomechanical lesions of cervical region: Secondary | ICD-10-CM | POA: Diagnosis not present

## 2013-10-19 DIAGNOSIS — M5430 Sciatica, unspecified side: Secondary | ICD-10-CM | POA: Diagnosis not present

## 2013-10-19 DIAGNOSIS — M531 Cervicobrachial syndrome: Secondary | ICD-10-CM | POA: Diagnosis not present

## 2013-10-19 DIAGNOSIS — M999 Biomechanical lesion, unspecified: Secondary | ICD-10-CM | POA: Diagnosis not present

## 2013-10-22 DIAGNOSIS — M531 Cervicobrachial syndrome: Secondary | ICD-10-CM | POA: Diagnosis not present

## 2013-10-22 DIAGNOSIS — M5430 Sciatica, unspecified side: Secondary | ICD-10-CM | POA: Diagnosis not present

## 2013-10-22 DIAGNOSIS — M999 Biomechanical lesion, unspecified: Secondary | ICD-10-CM | POA: Diagnosis not present

## 2013-10-22 DIAGNOSIS — M9981 Other biomechanical lesions of cervical region: Secondary | ICD-10-CM | POA: Diagnosis not present

## 2013-10-29 DIAGNOSIS — M9981 Other biomechanical lesions of cervical region: Secondary | ICD-10-CM | POA: Diagnosis not present

## 2013-10-29 DIAGNOSIS — M999 Biomechanical lesion, unspecified: Secondary | ICD-10-CM | POA: Diagnosis not present

## 2013-10-29 DIAGNOSIS — M5430 Sciatica, unspecified side: Secondary | ICD-10-CM | POA: Diagnosis not present

## 2013-10-29 DIAGNOSIS — M531 Cervicobrachial syndrome: Secondary | ICD-10-CM | POA: Diagnosis not present

## 2013-11-02 DIAGNOSIS — M999 Biomechanical lesion, unspecified: Secondary | ICD-10-CM | POA: Diagnosis not present

## 2013-11-02 DIAGNOSIS — M9981 Other biomechanical lesions of cervical region: Secondary | ICD-10-CM | POA: Diagnosis not present

## 2013-11-02 DIAGNOSIS — M5430 Sciatica, unspecified side: Secondary | ICD-10-CM | POA: Diagnosis not present

## 2013-11-02 DIAGNOSIS — M531 Cervicobrachial syndrome: Secondary | ICD-10-CM | POA: Diagnosis not present

## 2013-11-05 DIAGNOSIS — M9981 Other biomechanical lesions of cervical region: Secondary | ICD-10-CM | POA: Diagnosis not present

## 2013-11-05 DIAGNOSIS — M531 Cervicobrachial syndrome: Secondary | ICD-10-CM | POA: Diagnosis not present

## 2013-11-05 DIAGNOSIS — M5430 Sciatica, unspecified side: Secondary | ICD-10-CM | POA: Diagnosis not present

## 2013-11-05 DIAGNOSIS — M999 Biomechanical lesion, unspecified: Secondary | ICD-10-CM | POA: Diagnosis not present

## 2013-11-08 DIAGNOSIS — H02839 Dermatochalasis of unspecified eye, unspecified eyelid: Secondary | ICD-10-CM | POA: Diagnosis not present

## 2013-11-08 DIAGNOSIS — H40019 Open angle with borderline findings, low risk, unspecified eye: Secondary | ICD-10-CM | POA: Diagnosis not present

## 2013-11-08 DIAGNOSIS — H2510 Age-related nuclear cataract, unspecified eye: Secondary | ICD-10-CM | POA: Diagnosis not present

## 2013-11-09 DIAGNOSIS — M5430 Sciatica, unspecified side: Secondary | ICD-10-CM | POA: Diagnosis not present

## 2013-11-09 DIAGNOSIS — M9981 Other biomechanical lesions of cervical region: Secondary | ICD-10-CM | POA: Diagnosis not present

## 2013-11-09 DIAGNOSIS — M999 Biomechanical lesion, unspecified: Secondary | ICD-10-CM | POA: Diagnosis not present

## 2013-11-09 DIAGNOSIS — M531 Cervicobrachial syndrome: Secondary | ICD-10-CM | POA: Diagnosis not present

## 2013-11-14 DIAGNOSIS — M5430 Sciatica, unspecified side: Secondary | ICD-10-CM | POA: Diagnosis not present

## 2013-11-14 DIAGNOSIS — M9981 Other biomechanical lesions of cervical region: Secondary | ICD-10-CM | POA: Diagnosis not present

## 2013-11-14 DIAGNOSIS — M531 Cervicobrachial syndrome: Secondary | ICD-10-CM | POA: Diagnosis not present

## 2013-11-14 DIAGNOSIS — M999 Biomechanical lesion, unspecified: Secondary | ICD-10-CM | POA: Diagnosis not present

## 2013-11-16 DIAGNOSIS — M5430 Sciatica, unspecified side: Secondary | ICD-10-CM | POA: Diagnosis not present

## 2013-11-16 DIAGNOSIS — M999 Biomechanical lesion, unspecified: Secondary | ICD-10-CM | POA: Diagnosis not present

## 2013-11-16 DIAGNOSIS — M9981 Other biomechanical lesions of cervical region: Secondary | ICD-10-CM | POA: Diagnosis not present

## 2013-11-16 DIAGNOSIS — M531 Cervicobrachial syndrome: Secondary | ICD-10-CM | POA: Diagnosis not present

## 2013-11-28 DIAGNOSIS — M999 Biomechanical lesion, unspecified: Secondary | ICD-10-CM | POA: Diagnosis not present

## 2013-11-28 DIAGNOSIS — M9981 Other biomechanical lesions of cervical region: Secondary | ICD-10-CM | POA: Diagnosis not present

## 2013-11-28 DIAGNOSIS — M5430 Sciatica, unspecified side: Secondary | ICD-10-CM | POA: Diagnosis not present

## 2013-11-28 DIAGNOSIS — M531 Cervicobrachial syndrome: Secondary | ICD-10-CM | POA: Diagnosis not present

## 2013-11-30 DIAGNOSIS — M5430 Sciatica, unspecified side: Secondary | ICD-10-CM | POA: Diagnosis not present

## 2013-11-30 DIAGNOSIS — M999 Biomechanical lesion, unspecified: Secondary | ICD-10-CM | POA: Diagnosis not present

## 2013-11-30 DIAGNOSIS — M9981 Other biomechanical lesions of cervical region: Secondary | ICD-10-CM | POA: Diagnosis not present

## 2013-11-30 DIAGNOSIS — M531 Cervicobrachial syndrome: Secondary | ICD-10-CM | POA: Diagnosis not present

## 2013-12-05 DIAGNOSIS — M545 Low back pain: Secondary | ICD-10-CM | POA: Diagnosis not present

## 2013-12-05 DIAGNOSIS — I2510 Atherosclerotic heart disease of native coronary artery without angina pectoris: Secondary | ICD-10-CM | POA: Diagnosis not present

## 2013-12-05 DIAGNOSIS — M999 Biomechanical lesion, unspecified: Secondary | ICD-10-CM | POA: Diagnosis not present

## 2013-12-05 DIAGNOSIS — M729 Fibroblastic disorder, unspecified: Secondary | ICD-10-CM | POA: Diagnosis not present

## 2013-12-12 DIAGNOSIS — M999 Biomechanical lesion, unspecified: Secondary | ICD-10-CM | POA: Diagnosis not present

## 2013-12-12 DIAGNOSIS — M729 Fibroblastic disorder, unspecified: Secondary | ICD-10-CM | POA: Diagnosis not present

## 2013-12-12 DIAGNOSIS — M545 Low back pain: Secondary | ICD-10-CM | POA: Diagnosis not present

## 2013-12-14 DIAGNOSIS — M545 Low back pain: Secondary | ICD-10-CM | POA: Diagnosis not present

## 2013-12-14 DIAGNOSIS — M999 Biomechanical lesion, unspecified: Secondary | ICD-10-CM | POA: Diagnosis not present

## 2013-12-14 DIAGNOSIS — M729 Fibroblastic disorder, unspecified: Secondary | ICD-10-CM | POA: Diagnosis not present

## 2013-12-31 DIAGNOSIS — L821 Other seborrheic keratosis: Secondary | ICD-10-CM | POA: Diagnosis not present

## 2013-12-31 DIAGNOSIS — D692 Other nonthrombocytopenic purpura: Secondary | ICD-10-CM | POA: Diagnosis not present

## 2013-12-31 DIAGNOSIS — L570 Actinic keratosis: Secondary | ICD-10-CM | POA: Diagnosis not present

## 2013-12-31 DIAGNOSIS — B353 Tinea pedis: Secondary | ICD-10-CM | POA: Diagnosis not present

## 2013-12-31 DIAGNOSIS — Z8589 Personal history of malignant neoplasm of other organs and systems: Secondary | ICD-10-CM | POA: Diagnosis not present

## 2014-01-02 DIAGNOSIS — M999 Biomechanical lesion, unspecified: Secondary | ICD-10-CM | POA: Diagnosis not present

## 2014-01-02 DIAGNOSIS — M545 Low back pain, unspecified: Secondary | ICD-10-CM | POA: Diagnosis not present

## 2014-01-02 DIAGNOSIS — M729 Fibroblastic disorder, unspecified: Secondary | ICD-10-CM | POA: Diagnosis not present

## 2014-01-03 DIAGNOSIS — Z951 Presence of aortocoronary bypass graft: Secondary | ICD-10-CM | POA: Diagnosis not present

## 2014-01-03 DIAGNOSIS — I2510 Atherosclerotic heart disease of native coronary artery without angina pectoris: Secondary | ICD-10-CM | POA: Diagnosis not present

## 2014-01-03 DIAGNOSIS — Z9861 Coronary angioplasty status: Secondary | ICD-10-CM | POA: Diagnosis not present

## 2014-01-03 DIAGNOSIS — R071 Chest pain on breathing: Secondary | ICD-10-CM | POA: Diagnosis not present

## 2014-01-04 DIAGNOSIS — M729 Fibroblastic disorder, unspecified: Secondary | ICD-10-CM | POA: Diagnosis not present

## 2014-01-04 DIAGNOSIS — M999 Biomechanical lesion, unspecified: Secondary | ICD-10-CM | POA: Diagnosis not present

## 2014-01-04 DIAGNOSIS — M545 Low back pain, unspecified: Secondary | ICD-10-CM | POA: Diagnosis not present

## 2014-01-09 DIAGNOSIS — M729 Fibroblastic disorder, unspecified: Secondary | ICD-10-CM | POA: Diagnosis not present

## 2014-01-09 DIAGNOSIS — M999 Biomechanical lesion, unspecified: Secondary | ICD-10-CM | POA: Diagnosis not present

## 2014-01-09 DIAGNOSIS — M545 Low back pain, unspecified: Secondary | ICD-10-CM | POA: Diagnosis not present

## 2014-01-11 DIAGNOSIS — M999 Biomechanical lesion, unspecified: Secondary | ICD-10-CM | POA: Diagnosis not present

## 2014-01-11 DIAGNOSIS — M9981 Other biomechanical lesions of cervical region: Secondary | ICD-10-CM | POA: Diagnosis not present

## 2014-01-11 DIAGNOSIS — M549 Dorsalgia, unspecified: Secondary | ICD-10-CM | POA: Diagnosis not present

## 2014-01-11 DIAGNOSIS — M542 Cervicalgia: Secondary | ICD-10-CM | POA: Diagnosis not present

## 2014-01-16 DIAGNOSIS — M999 Biomechanical lesion, unspecified: Secondary | ICD-10-CM | POA: Diagnosis not present

## 2014-01-16 DIAGNOSIS — M549 Dorsalgia, unspecified: Secondary | ICD-10-CM | POA: Diagnosis not present

## 2014-01-16 DIAGNOSIS — M542 Cervicalgia: Secondary | ICD-10-CM | POA: Diagnosis not present

## 2014-01-16 DIAGNOSIS — M9981 Other biomechanical lesions of cervical region: Secondary | ICD-10-CM | POA: Diagnosis not present

## 2014-01-23 DIAGNOSIS — M999 Biomechanical lesion, unspecified: Secondary | ICD-10-CM | POA: Diagnosis not present

## 2014-01-23 DIAGNOSIS — M549 Dorsalgia, unspecified: Secondary | ICD-10-CM | POA: Diagnosis not present

## 2014-01-23 DIAGNOSIS — M542 Cervicalgia: Secondary | ICD-10-CM | POA: Diagnosis not present

## 2014-01-23 DIAGNOSIS — M9981 Other biomechanical lesions of cervical region: Secondary | ICD-10-CM | POA: Diagnosis not present

## 2014-01-30 DIAGNOSIS — M549 Dorsalgia, unspecified: Secondary | ICD-10-CM | POA: Diagnosis not present

## 2014-01-30 DIAGNOSIS — M999 Biomechanical lesion, unspecified: Secondary | ICD-10-CM | POA: Diagnosis not present

## 2014-01-30 DIAGNOSIS — M542 Cervicalgia: Secondary | ICD-10-CM | POA: Diagnosis not present

## 2014-01-30 DIAGNOSIS — M9981 Other biomechanical lesions of cervical region: Secondary | ICD-10-CM | POA: Diagnosis not present

## 2014-01-31 DIAGNOSIS — J438 Other emphysema: Secondary | ICD-10-CM | POA: Diagnosis not present

## 2014-01-31 DIAGNOSIS — IMO0002 Reserved for concepts with insufficient information to code with codable children: Secondary | ICD-10-CM | POA: Diagnosis not present

## 2014-01-31 DIAGNOSIS — R0789 Other chest pain: Secondary | ICD-10-CM | POA: Diagnosis not present

## 2014-01-31 DIAGNOSIS — R071 Chest pain on breathing: Secondary | ICD-10-CM | POA: Diagnosis not present

## 2014-01-31 DIAGNOSIS — I2510 Atherosclerotic heart disease of native coronary artery without angina pectoris: Secondary | ICD-10-CM | POA: Diagnosis not present

## 2014-01-31 DIAGNOSIS — R599 Enlarged lymph nodes, unspecified: Secondary | ICD-10-CM | POA: Diagnosis not present

## 2014-01-31 DIAGNOSIS — R918 Other nonspecific abnormal finding of lung field: Secondary | ICD-10-CM | POA: Diagnosis not present

## 2014-02-06 DIAGNOSIS — M542 Cervicalgia: Secondary | ICD-10-CM | POA: Diagnosis not present

## 2014-02-06 DIAGNOSIS — M9981 Other biomechanical lesions of cervical region: Secondary | ICD-10-CM | POA: Diagnosis not present

## 2014-02-06 DIAGNOSIS — M549 Dorsalgia, unspecified: Secondary | ICD-10-CM | POA: Diagnosis not present

## 2014-02-06 DIAGNOSIS — M999 Biomechanical lesion, unspecified: Secondary | ICD-10-CM | POA: Diagnosis not present

## 2014-02-20 DIAGNOSIS — M542 Cervicalgia: Secondary | ICD-10-CM | POA: Diagnosis not present

## 2014-02-20 DIAGNOSIS — M999 Biomechanical lesion, unspecified: Secondary | ICD-10-CM | POA: Diagnosis not present

## 2014-02-20 DIAGNOSIS — M549 Dorsalgia, unspecified: Secondary | ICD-10-CM | POA: Diagnosis not present

## 2014-02-20 DIAGNOSIS — M9981 Other biomechanical lesions of cervical region: Secondary | ICD-10-CM | POA: Diagnosis not present

## 2014-02-27 DIAGNOSIS — M999 Biomechanical lesion, unspecified: Secondary | ICD-10-CM | POA: Diagnosis not present

## 2014-02-27 DIAGNOSIS — M9981 Other biomechanical lesions of cervical region: Secondary | ICD-10-CM | POA: Diagnosis not present

## 2014-02-27 DIAGNOSIS — M549 Dorsalgia, unspecified: Secondary | ICD-10-CM | POA: Diagnosis not present

## 2014-02-27 DIAGNOSIS — M542 Cervicalgia: Secondary | ICD-10-CM | POA: Diagnosis not present

## 2014-03-06 DIAGNOSIS — M549 Dorsalgia, unspecified: Secondary | ICD-10-CM | POA: Diagnosis not present

## 2014-03-06 DIAGNOSIS — M9981 Other biomechanical lesions of cervical region: Secondary | ICD-10-CM | POA: Diagnosis not present

## 2014-03-06 DIAGNOSIS — M542 Cervicalgia: Secondary | ICD-10-CM | POA: Diagnosis not present

## 2014-03-06 DIAGNOSIS — M999 Biomechanical lesion, unspecified: Secondary | ICD-10-CM | POA: Diagnosis not present

## 2014-03-13 DIAGNOSIS — M999 Biomechanical lesion, unspecified: Secondary | ICD-10-CM | POA: Diagnosis not present

## 2014-03-13 DIAGNOSIS — M9981 Other biomechanical lesions of cervical region: Secondary | ICD-10-CM | POA: Diagnosis not present

## 2014-03-13 DIAGNOSIS — M549 Dorsalgia, unspecified: Secondary | ICD-10-CM | POA: Diagnosis not present

## 2014-03-13 DIAGNOSIS — M542 Cervicalgia: Secondary | ICD-10-CM | POA: Diagnosis not present

## 2014-03-18 DIAGNOSIS — M542 Cervicalgia: Secondary | ICD-10-CM | POA: Diagnosis not present

## 2014-03-18 DIAGNOSIS — M999 Biomechanical lesion, unspecified: Secondary | ICD-10-CM | POA: Diagnosis not present

## 2014-03-18 DIAGNOSIS — M9981 Other biomechanical lesions of cervical region: Secondary | ICD-10-CM | POA: Diagnosis not present

## 2014-03-18 DIAGNOSIS — M549 Dorsalgia, unspecified: Secondary | ICD-10-CM | POA: Diagnosis not present

## 2014-03-25 DIAGNOSIS — M542 Cervicalgia: Secondary | ICD-10-CM | POA: Diagnosis not present

## 2014-03-25 DIAGNOSIS — M549 Dorsalgia, unspecified: Secondary | ICD-10-CM | POA: Diagnosis not present

## 2014-03-25 DIAGNOSIS — M9981 Other biomechanical lesions of cervical region: Secondary | ICD-10-CM | POA: Diagnosis not present

## 2014-03-25 DIAGNOSIS — M999 Biomechanical lesion, unspecified: Secondary | ICD-10-CM | POA: Diagnosis not present

## 2014-04-01 DIAGNOSIS — M9981 Other biomechanical lesions of cervical region: Secondary | ICD-10-CM | POA: Diagnosis not present

## 2014-04-01 DIAGNOSIS — M999 Biomechanical lesion, unspecified: Secondary | ICD-10-CM | POA: Diagnosis not present

## 2014-04-01 DIAGNOSIS — M549 Dorsalgia, unspecified: Secondary | ICD-10-CM | POA: Diagnosis not present

## 2014-04-01 DIAGNOSIS — M542 Cervicalgia: Secondary | ICD-10-CM | POA: Diagnosis not present

## 2014-04-08 DIAGNOSIS — M542 Cervicalgia: Secondary | ICD-10-CM | POA: Diagnosis not present

## 2014-04-08 DIAGNOSIS — M9981 Other biomechanical lesions of cervical region: Secondary | ICD-10-CM | POA: Diagnosis not present

## 2014-04-08 DIAGNOSIS — M549 Dorsalgia, unspecified: Secondary | ICD-10-CM | POA: Diagnosis not present

## 2014-04-08 DIAGNOSIS — M999 Biomechanical lesion, unspecified: Secondary | ICD-10-CM | POA: Diagnosis not present

## 2014-06-19 DIAGNOSIS — N200 Calculus of kidney: Secondary | ICD-10-CM | POA: Diagnosis not present

## 2014-07-11 DIAGNOSIS — I2510 Atherosclerotic heart disease of native coronary artery without angina pectoris: Secondary | ICD-10-CM | POA: Diagnosis not present

## 2014-09-04 DIAGNOSIS — H40019 Open angle with borderline findings, low risk, unspecified eye: Secondary | ICD-10-CM | POA: Diagnosis not present

## 2014-09-04 DIAGNOSIS — H2510 Age-related nuclear cataract, unspecified eye: Secondary | ICD-10-CM | POA: Diagnosis not present

## 2014-09-04 DIAGNOSIS — Z7982 Long term (current) use of aspirin: Secondary | ICD-10-CM | POA: Diagnosis not present

## 2014-09-04 DIAGNOSIS — H02839 Dermatochalasis of unspecified eye, unspecified eyelid: Secondary | ICD-10-CM | POA: Diagnosis not present

## 2014-09-04 DIAGNOSIS — Z87891 Personal history of nicotine dependence: Secondary | ICD-10-CM | POA: Diagnosis not present

## 2014-09-04 DIAGNOSIS — H4320 Crystalline deposits in vitreous body, unspecified eye: Secondary | ICD-10-CM | POA: Diagnosis not present

## 2014-10-24 DIAGNOSIS — R0789 Other chest pain: Secondary | ICD-10-CM | POA: Diagnosis not present

## 2014-10-24 DIAGNOSIS — G8912 Acute post-thoracotomy pain: Secondary | ICD-10-CM | POA: Diagnosis not present

## 2014-10-24 DIAGNOSIS — Z8673 Personal history of transient ischemic attack (TIA), and cerebral infarction without residual deficits: Secondary | ICD-10-CM | POA: Diagnosis not present

## 2014-10-24 DIAGNOSIS — Z87891 Personal history of nicotine dependence: Secondary | ICD-10-CM | POA: Diagnosis not present

## 2014-10-24 DIAGNOSIS — E785 Hyperlipidemia, unspecified: Secondary | ICD-10-CM | POA: Diagnosis not present

## 2015-01-06 DIAGNOSIS — Z87442 Personal history of urinary calculi: Secondary | ICD-10-CM | POA: Diagnosis not present

## 2015-01-06 DIAGNOSIS — Z87891 Personal history of nicotine dependence: Secondary | ICD-10-CM | POA: Diagnosis not present

## 2015-01-06 DIAGNOSIS — Z7982 Long term (current) use of aspirin: Secondary | ICD-10-CM | POA: Diagnosis not present

## 2015-01-06 DIAGNOSIS — M545 Low back pain: Secondary | ICD-10-CM | POA: Diagnosis not present

## 2015-01-06 DIAGNOSIS — M549 Dorsalgia, unspecified: Secondary | ICD-10-CM | POA: Diagnosis not present

## 2015-01-06 DIAGNOSIS — N200 Calculus of kidney: Secondary | ICD-10-CM | POA: Diagnosis not present

## 2015-01-06 DIAGNOSIS — N401 Enlarged prostate with lower urinary tract symptoms: Secondary | ICD-10-CM | POA: Diagnosis not present

## 2015-01-23 DIAGNOSIS — M5136 Other intervertebral disc degeneration, lumbar region: Secondary | ICD-10-CM | POA: Diagnosis not present

## 2015-03-04 DIAGNOSIS — I253 Aneurysm of heart: Secondary | ICD-10-CM | POA: Diagnosis not present

## 2015-03-07 DIAGNOSIS — L820 Inflamed seborrheic keratosis: Secondary | ICD-10-CM | POA: Diagnosis not present

## 2015-03-07 DIAGNOSIS — Z872 Personal history of diseases of the skin and subcutaneous tissue: Secondary | ICD-10-CM | POA: Diagnosis not present

## 2015-03-07 DIAGNOSIS — L570 Actinic keratosis: Secondary | ICD-10-CM | POA: Diagnosis not present

## 2015-03-07 DIAGNOSIS — Z8589 Personal history of malignant neoplasm of other organs and systems: Secondary | ICD-10-CM | POA: Diagnosis not present

## 2015-04-10 DIAGNOSIS — I253 Aneurysm of heart: Secondary | ICD-10-CM | POA: Diagnosis not present

## 2015-04-10 DIAGNOSIS — I2510 Atherosclerotic heart disease of native coronary artery without angina pectoris: Secondary | ICD-10-CM | POA: Diagnosis not present

## 2015-04-15 ENCOUNTER — Institutional Professional Consult (permissible substitution): Payer: Self-pay | Admitting: Cardiology

## 2015-04-24 ENCOUNTER — Telehealth: Payer: Self-pay | Admitting: Cardiology

## 2015-04-24 NOTE — Telephone Encounter (Signed)
Pt dropped off his own records this am for an apt with Dr.Nelson 5/13 placed in chart prep bin.

## 2015-05-05 ENCOUNTER — Encounter: Payer: Self-pay | Admitting: *Deleted

## 2015-05-09 ENCOUNTER — Ambulatory Visit (INDEPENDENT_AMBULATORY_CARE_PROVIDER_SITE_OTHER): Payer: Medicare Other | Admitting: Cardiology

## 2015-05-09 ENCOUNTER — Encounter: Payer: Self-pay | Admitting: *Deleted

## 2015-05-09 VITALS — BP 140/70 | HR 63 | Ht 65.0 in | Wt 161.0 lb

## 2015-05-09 DIAGNOSIS — I480 Paroxysmal atrial fibrillation: Secondary | ICD-10-CM

## 2015-05-09 DIAGNOSIS — E785 Hyperlipidemia, unspecified: Secondary | ICD-10-CM | POA: Diagnosis not present

## 2015-05-09 DIAGNOSIS — I10 Essential (primary) hypertension: Secondary | ICD-10-CM

## 2015-05-09 DIAGNOSIS — I25709 Atherosclerosis of coronary artery bypass graft(s), unspecified, with unspecified angina pectoris: Secondary | ICD-10-CM

## 2015-05-09 DIAGNOSIS — Z951 Presence of aortocoronary bypass graft: Secondary | ICD-10-CM

## 2015-05-09 DIAGNOSIS — I253 Aneurysm of heart: Secondary | ICD-10-CM

## 2015-05-09 DIAGNOSIS — I739 Peripheral vascular disease, unspecified: Secondary | ICD-10-CM

## 2015-05-09 DIAGNOSIS — I25810 Atherosclerosis of coronary artery bypass graft(s) without angina pectoris: Secondary | ICD-10-CM | POA: Insufficient documentation

## 2015-05-09 NOTE — Patient Instructions (Signed)
Medication Instructions:   STOP PLAVIX NOW    Labwork:  IN 6 MONTHS PRIOR TO YOUR 6 MONTH FOLLOW-UP APPOINTMENT WITH DR NELSON---CHECK A CMET, CBC W DIFF, LIPIDS---PLEASE COME FASTING TO THIS APPOINTMENT     Testing/Procedures:  Your physician has requested that you have a cardiac MRI. Cardiac MRI uses a computer to create images of your heart as its beating, producing both still and moving pictures of your heart and major blood vessels. For further information please visit [email or web address]. Please follow the instruction sheet given to you today for more information.  DR Delton SeeNELSON WOULD LIKE FOR THIS TO BE SCHEDULED FOR HER TO READ PRIOR TO YOUR 6 MONTH FOLLOW-UP APPOINTMENT WITH HER.     Follow-Up:  Your physician wants you to follow-up in: 6 MONTHS WITH DR Johnell ComingsNELSON You will receive a reminder letter in the mail two months in advance. If you don't receive a letter, please call our office to schedule the follow-up appointment.

## 2015-05-09 NOTE — Progress Notes (Signed)
Patient ID: Tivis RingerGary Wessler, male   DOB: [birth year], 69 y.o.   MRN: [id number]      Cardiology Office Note  Date:  05/09/2015   ID:  Tivis RingerGary Keil, DOB [birth year], MRN [id number]  PCP:  No primary care provider on file.  Cardiologist:Dayona Shaheen, Faustino CongressKATARINA H, MD   Chief compliant: Re-establish cardiology care  History of Present Illness: Tivis RingerGary Cambria is a 69 y.o. male, former smoker with PMH od CAD, s/p MI in 2013, s/p CABG with LIMA/LAD, SVG/D1, SVG/PDA (distal LCX) with atypical left cheek twitching, mild fatigue, followed by PCI to distal LCX because of failed SVG to PDA in 2014. All his work up and treatment has been [facility], he now wants to switch to our care. 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 He was started on Warfarin. The patient is moderately active, mostly in his back yard and denies and chest pain, DOE, orthopnea, LE edema, palpitations or syncope.  He states that both of his calves get tired while walking.   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  PMH includes manic depressive disorder, 3 PPD x 30 years, quit 3 years ago, GERD   Past Medical History  Diagnosis Date  . CAD (coronary artery disease)   . DDD (degenerative disc disease), lumbar   . A-fib   . Psoriasis     Past Surgical History  Procedure Laterality Date  . Coronary artery bypass graft  2013  . Cardiac catheterization       Current Outpatient Prescriptions  Medication Sig Dispense Refill  . aspirin 81 MG tablet Take 81 mg by mouth daily.    . Calcium Carbonate-Vitamin D (CALCIUM + D PO) Take 500 mg by mouth daily.     . clopidogrel (PLAVIX) 75 MG tablet Take 75 mg by mouth daily.    . metoprolol tartrate (LOPRESSOR) 25 MG tablet Take 25 mg by mouth 2 (two) times daily.    . nitroGLYCERIN (NITROSTAT) 0.4 MG SL tablet Place 0.4 mg under the tongue every 5 (five) minutes as needed for chest pain.    . pantoprazole (PROTONIX) 40 MG tablet Take 40 mg by mouth daily.    . simvastatin (ZOCOR) 80 MG tablet Take 80 mg by mouth daily. TAKE ONE-HALF TABLET BY MOUTH DAILY AT BED TIME    . warfarin (COUMADIN) 5 MG tablet Take 5 mg by mouth daily.     No current facility-administered medications for this visit.    Allergies:   Codeine; Oxycodone-acetaminophen; and Vicodin    Social History:  The patient  reports that he has quit smoking. He does not have any smokeless tobacco history on file. He reports that he does not drink alcohol or use illicit drugs.   Family History:  The patient's family history includes Heart attack (age of onset: 5670) in his father.    ROS:  Please see the history of present illness.   Otherwise, review of systems are positive for none.   All other systems are reviewed and negative.    PHYSICAL EXAM: VS:  BP 140/70 mmHg  Pulse 63  Ht 5\' 5"  (1.651 m)  Wt 161 lb (73.029 kg)  BMI 26.79 kg/m2 , BMI Body mass index is 26.79 kg/(m^2). GEN: Well nourished, well developed, in no acute distress HEENT: normal Neck: no JVD, carotid bruits, or masses Cardiac: RRR;2/6 systolic murmur, rubs, or gallops,no edema  Respiratory:  clear to auscultation bilaterally, normal work of breathing GI: soft, nontender, nondistended, + BS MS: no deformity or atrophy Skin: warm and dry, no rash Neuro:  Strength and sensation are intact Psych: euthymic mood, full affect   EKG:  EKG is ordered today. The ekg ordered today demonstrates SR, inferior MI, age undetermined   Recent Labs: No results found for requested labs within last 365 days.    Lipid Panel No results found for: CHOL, TRIG, HDL,  CHOLHDL, VLDL, LDLCALC, LDLDIRECT    Wt Readings from Last 3 Encounters:  05/09/15 161 lb (73.029 kg)      Other studies Reviewed: Additional studies/ records that were reviewed today include: extensive [facility] including a cath, CABG, chest CT, echocardiogram, cardiac MRI, I have personally reviewed cardiac MRI CD Review of the above records demonstrates: as described in HPI   ASSESSMENT AND PLAN:  69 year old former heavy smoker   1. CAD, s/p CABG and PCI as above, we will continue aspirin 81 mg po daily, we will discontinue plavix as the patient describes significant abdominal pain on combination with ASA/plavix/warfarin Continue metoprolol. He is encouraged to exercise.  2. LV aneurysm/thrombus - continue warfarin, we will repeat cardiac MRI in 6 months, the last INR 2.8  3. HTN - well controlled  4. Hyperlipidemia - well controlled on simvastatin 80 mg po daily  5. Paroxysmal a-fib - only post op, now in SR, denies palpitations   6. PAD - weak pulses and claudications the patient wants to just increase his exercise for now, we will postpone LE duplex for now.   Follow up in 6 menths, we will repeat cardiac MRI, CMP, lipids. CBC prior to the follow up.   Total time spent with the patient and reviewing his records 45 minutes.   Signed, Lars MassonNELSON, Shayanna Thatch H, MD  05/09/2015 11:00 AM    [facility] [address], [place], [place]  [address] Phone: [phone](336) [phone]; Fax: [phone]) [phone]

## 2015-06-03 ENCOUNTER — Encounter: Payer: Self-pay | Admitting: Cardiology

## 2015-08-12 ENCOUNTER — Telehealth: Payer: Self-pay | Admitting: Cardiology

## 2015-08-12 NOTE — Telephone Encounter (Signed)
Pt calling in to inform us that he will be having his cardiac MRI scheduled for Dr Delton See to read on October 03, 2015 and then he will have a follow-up lab appt and regular OV appt scheduled for sometime in November or December, according to his recall letter.  Pt just wanting to clarify that he will not have to wait until November follow-up OV with Dr Delton See until he receives his Cardiac MRI results.  Pt states he would like a call back with his cardiac MRI results prior to his follow-up appt with Delton See in sometime in November or December.  I assured the pt that he will not have to wait all the way until his follow-up appt with Dr Delton See to get his Cardiac MRI results.  Informed the pt that either Dr Delton See or myself (after she results the cardiac MRI), will call him after his Cardiac MRI is read by her, to endorse this to him.  Assured the pt that the turnover time is fairly quick with her resulting cardiac MRIs.  Pt verbalized understanding, agrees with this plan, and states "I feel very relieved to know I will get my results before my follow-up appt."

## 2015-08-12 NOTE — Telephone Encounter (Signed)
New Message   Pt is calling to get an appt per letter for Dr. Delton See but  Pt is having an MRI in October 7th at [facility] and he wants to come in and get his results within the same week,   pt want so make sure that he is not charged for coming in to the office before the time period for his insurance  Pt want to know how soon can he get the results?   pt wants to know if he can get his results over the phone?

## 2015-09-23 ENCOUNTER — Other Ambulatory Visit (INDEPENDENT_AMBULATORY_CARE_PROVIDER_SITE_OTHER): Payer: Medicare Other | Admitting: *Deleted

## 2015-09-23 DIAGNOSIS — I25709 Atherosclerosis of coronary artery bypass graft(s), unspecified, with unspecified angina pectoris: Secondary | ICD-10-CM | POA: Diagnosis not present

## 2015-09-23 DIAGNOSIS — Z951 Presence of aortocoronary bypass graft: Secondary | ICD-10-CM | POA: Diagnosis not present

## 2015-09-23 DIAGNOSIS — I253 Aneurysm of heart: Secondary | ICD-10-CM

## 2015-09-23 DIAGNOSIS — E785 Hyperlipidemia, unspecified: Secondary | ICD-10-CM | POA: Diagnosis not present

## 2015-09-23 DIAGNOSIS — I480 Paroxysmal atrial fibrillation: Secondary | ICD-10-CM | POA: Diagnosis not present

## 2015-09-23 LAB — CBC WITH DIFFERENTIAL/PLATELET
Basophils Absolute: 0 10*3/uL (ref 0.0–0.1)
Basophils Relative: 0.7 % (ref 0.0–3.0)
Eosinophils Absolute: 0.2 10*3/uL (ref 0.0–0.7)
Eosinophils Relative: 2.4 % (ref 0.0–5.0)
HCT: 43.1 % (ref 39.0–52.0)
Hemoglobin: 14.7 g/dL (ref 13.0–17.0)
Lymphocytes Relative: 35.3 % (ref 12.0–46.0)
Lymphs Abs: 2.6 10*3/uL (ref 0.7–4.0)
MCHC: 34 g/dL (ref 30.0–36.0)
MCV: 93.9 fl (ref 78.0–100.0)
Monocytes Absolute: 0.7 10*3/uL (ref 0.1–1.0)
Monocytes Relative: 9.3 % (ref 3.0–12.0)
Neutro Abs: 3.9 10*3/uL (ref 1.4–7.7)
Neutrophils Relative %: 52.3 % (ref 43.0–77.0)
Platelets: 252 10*3/uL (ref 150.0–400.0)
RBC: 4.59 Mil/uL (ref 4.22–5.81)
RDW: 13.4 % (ref 11.5–15.5)
WBC: 7.4 10*3/uL (ref 4.0–10.5)

## 2015-09-23 LAB — COMPREHENSIVE METABOLIC PANEL
ALT: 19 U/L (ref 0–53)
AST: 17 U/L (ref 0–37)
Albumin: 4.3 g/dL (ref 3.5–5.2)
Alkaline Phosphatase: 55 U/L (ref 39–117)
BUN: 21 mg/dL (ref 6–23)
CO2: 28 mEq/L (ref 19–32)
Calcium: 9.3 mg/dL (ref 8.4–10.5)
Chloride: 104 mEq/L (ref 96–112)
Creatinine, Ser: 0.73 mg/dL (ref 0.40–1.50)
GFR: 112.94 mL/min (ref >60.00)
Glucose, Bld: 109 mg/dL — ABNORMAL HIGH (ref 70–99)
Potassium: 4.6 mEq/L (ref 3.5–5.1)
Sodium: 139 mEq/L (ref 135–145)
Total Bilirubin: 0.6 mg/dL (ref 0.2–1.2)
Total Protein: 6.9 g/dL (ref 6.0–8.3)

## 2015-09-23 LAB — LIPID PANEL
Cholesterol: 178 mg/dL (ref 0–200)
HDL: 49.7 mg/dL (ref >39.00)
LDL Cholesterol: 100 mg/dL — ABNORMAL HIGH (ref 0–99)
NonHDL: 128.21
Total CHOL/HDL Ratio: 4
Triglycerides: 142 mg/dL (ref 0.0–149.0)
VLDL: 28.4 mg/dL (ref 0.0–40.0)

## 2015-10-03 ENCOUNTER — Ambulatory Visit (HOSPITAL_COMMUNITY)
Admission: RE | Admit: 2015-10-03 | Discharge: 2015-10-03 | Disposition: A | Discharge status: Home / Self Care | Payer: No Typology Code available for payment source | Source: Ambulatory Visit | Attending: Cardiology | Admitting: Cardiology

## 2015-10-03 DIAGNOSIS — I253 Aneurysm of heart: Secondary | ICD-10-CM | POA: Insufficient documentation

## 2015-10-03 DIAGNOSIS — I340 Nonrheumatic mitral (valve) insufficiency: Secondary | ICD-10-CM | POA: Diagnosis not present

## 2015-10-03 DIAGNOSIS — Z951 Presence of aortocoronary bypass graft: Secondary | ICD-10-CM | POA: Diagnosis not present

## 2015-10-03 DIAGNOSIS — I480 Paroxysmal atrial fibrillation: Secondary | ICD-10-CM | POA: Insufficient documentation

## 2015-10-03 DIAGNOSIS — E785 Hyperlipidemia, unspecified: Secondary | ICD-10-CM | POA: Insufficient documentation

## 2015-10-03 DIAGNOSIS — I2510 Atherosclerotic heart disease of native coronary artery without angina pectoris: Secondary | ICD-10-CM

## 2015-10-03 DIAGNOSIS — I25709 Atherosclerosis of coronary artery bypass graft(s), unspecified, with unspecified angina pectoris: Secondary | ICD-10-CM | POA: Insufficient documentation

## 2015-10-03 DIAGNOSIS — I071 Rheumatic tricuspid insufficiency: Secondary | ICD-10-CM | POA: Diagnosis not present

## 2015-10-03 MED ORDER — GADOBENATE DIMEGLUMINE 529 MG/ML IV SOLN
25.0000 mL | Freq: Once | INTRAVENOUS | Status: AC
  Administered 2015-10-03: 25 mL via INTRAVENOUS

## 2015-10-06 ENCOUNTER — Telehealth: Payer: Self-pay | Admitting: Cardiology

## 2015-10-06 NOTE — Telephone Encounter (Signed)
This is the result note from patient's cardiac MRI. Left message for patient to call back.  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

## 2015-10-06 NOTE — Telephone Encounter (Signed)
**Note De-identified Tanner Hansen Obfuscation** The pt is advised and he verbalized understanding. 

## 2015-10-06 NOTE — Telephone Encounter (Signed)
New message    Patient calling for Cardiac Mri test results

## 2015-10-14 ENCOUNTER — Ambulatory Visit: Payer: Medicare Other | Admitting: Cardiology

## 2015-10-17 ENCOUNTER — Encounter: Payer: Self-pay | Admitting: Cardiology

## 2015-10-17 ENCOUNTER — Ambulatory Visit (INDEPENDENT_AMBULATORY_CARE_PROVIDER_SITE_OTHER): Payer: Medicare Other | Admitting: Cardiology

## 2015-10-17 VITALS — BP 166/82 | HR 67 | Ht 65.0 in | Wt 172.0 lb

## 2015-10-17 DIAGNOSIS — I513 Intracardiac thrombosis, not elsewhere classified: Secondary | ICD-10-CM

## 2015-10-17 DIAGNOSIS — I10 Essential (primary) hypertension: Secondary | ICD-10-CM

## 2015-10-17 DIAGNOSIS — I2510 Atherosclerotic heart disease of native coronary artery without angina pectoris: Secondary | ICD-10-CM

## 2015-10-17 DIAGNOSIS — I253 Aneurysm of heart: Secondary | ICD-10-CM

## 2015-10-17 DIAGNOSIS — Z951 Presence of aortocoronary bypass graft: Secondary | ICD-10-CM | POA: Diagnosis not present

## 2015-10-17 DIAGNOSIS — I2583 Coronary atherosclerosis due to lipid rich plaque: Secondary | ICD-10-CM

## 2015-10-17 DIAGNOSIS — I480 Paroxysmal atrial fibrillation: Secondary | ICD-10-CM

## 2015-10-17 DIAGNOSIS — I213 ST elevation (STEMI) myocardial infarction of unspecified site: Secondary | ICD-10-CM

## 2015-10-17 DIAGNOSIS — I236 Thrombosis of atrium, auricular appendage, and ventricle as current complications following acute myocardial infarction: Secondary | ICD-10-CM

## 2015-10-17 MED ORDER — LISINOPRIL 5 MG PO TABS: 5.0000 mg | ORAL_TABLET | Freq: Every day | ORAL | Status: AC

## 2015-10-17 NOTE — Progress Notes (Signed)
Patient ID: Tanner Hansen, male   DOB: 05/04/1946, 69 y.o.   MRN: [id number]      Cardiology Office Note  Date:  10/17/2015   ID:  Tanner Hansen, DOB July 05, 1946, MRN [id number]  PCP:  No PCP Per Patient  Cardiologist:Vera Wishart, Faustino Congress, MD   Chief compliant: Re-establish cardiology care  History of Present Illness: Tanner Hansen is a 69 y.o. male, former smoker with PMH od CAD, s/p MI in 2013, s/p CABG with LIMA/LAD, SVG/D1, SVG/PDA (distal LCX) with atypical left cheek twitching, mild fatigue, followed by PCI to distal LCX because of failed SVG to PDA in 2014. All his work up and treatment has been done at [facility], he now wants to switch to our care. 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 He was started on Warfarin. The patient is moderately active, mostly in his back yard and denies and chest pain, DOE, orthopnea, LE edema, palpitations or syncope.  He states that both of his calves get tired while walking.   10/17/2015 - 6 months follow up, the patient feels well denies palpitations, syncope, no bleeding with coumadin, complaint to his meds. No chest pain or DOE.   Past Medical History  Diagnosis Date  . CAD (coronary artery disease)   . DDD (degenerative disc disease), lumbar   . A-fib (HCC)   . Psoriasis     Past Surgical History  Procedure Laterality Date  . Coronary artery bypass graft  2013  . Cardiac catheterization       Current Outpatient Prescriptions  Medication Sig Dispense Refill  . aspirin 81 MG tablet Take 81 mg by mouth daily.    . Calcium Carbonate-Vitamin D (CALCIUM + D PO) Take 500 mg by mouth daily.    . metoprolol tartrate (LOPRESSOR) 25 MG tablet Take 25 mg by mouth 2 (two) times daily.    . nitroGLYCERIN (NITROSTAT) 0.4 MG SL tablet Place 0.4 mg under the tongue every 5 (five) minutes as needed for chest pain.    .  pantoprazole (PROTONIX) 40 MG tablet Take 40 mg by mouth daily.    . simvastatin (ZOCOR) 80 MG tablet Take 80 mg by mouth daily. TAKE ONE-HALF TABLET BY MOUTH DAILY AT BED TIME    . warfarin (COUMADIN) 5 MG tablet Take 5 mg by mouth daily.     No current facility-administered medications for this visit.    Allergies:   Codeine; Oxycodone-acetaminophen; and Vicodin    Social History:  The patient  reports that he has quit smoking. He does not have any smokeless tobacco history on file. He reports that he does not drink alcohol or use illicit drugs.   Family History:  The patient's family history includes CVA in an other family member; Cancer in his father; Heart attack (age of onset: 5) in his father; Heart disease in his father; Hypertension in his mother; Other in an other family member.    ROS:  Please see the history of present illness.   Otherwise, review of systems are positive for none.   All other systems are reviewed and negative.    PHYSICAL EXAM: VS:  BP 166/82 mmHg  Pulse 67  Ht  (1.651 m)  Wt 172 lb (78.019 kg)  BMI 28.62 kg/m2 , BMI Body mass index is 28.62 kg/(m^2). GEN: Well nourished, well developed, in no  acute distress HEENT: normal Neck: no JVD, carotid bruits, or masses Cardiac: RRR;2/6 systolic murmur, rubs, or gallops,no edema  Respiratory:  clear to auscultation bilaterally, normal work of breathing GI: soft, nontender, nondistended, + BS MS: no deformity or atrophy Skin: warm and dry, no rash Neuro:  Strength and sensation are intact Psych: euthymic mood, full affect   EKG:  EKG is ordered today. The ekg ordered today demonstrates SR, inferior MI, age undetermined   Recent Labs: 09/23/2015: ALT 19; BUN 21; Creatinine, Ser 0.73; Hemoglobin 14.7; Platelets 252.0; Potassium 4.6; Sodium 139    Lipid Panel    Component Value Date/Time   CHOL 178 09/23/2015 0739   TRIG 142.0 09/23/2015 0739   HDL 49.70 09/23/2015 0739   CHOLHDL 4 09/23/2015  0739   VLDL 28.4 09/23/2015 0739   LDLCALC 100* 09/23/2015 0739   Wt Readings from Last 3 Encounters:  10/17/15 172 lb (78.019 kg)  05/09/15 161 lb (73.029 kg)    Other studies Reviewed: Additional studies/ records that were reviewed today include: [facility] including a cath, CABG, chest CT, echocardiogram, cardiac MRI, I have personally reviewed cardiac MRI CD Review of the above records demonstrates: as described in HPI  Cardiac MRI 03/04/2015 CMR: LVEF 59%, 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  Cardiac MRI 10/03/2015 1. Normal left ventricular size, with mild basal septal hypertrophy and low normal systolic function (LVEF 53%). There is mid inferior wall aneurysm with dyskinesis. The aneurysm measures 34 x 24 mm at its neck and is 26 mm deep.  When compared to the prior report from 03/07/2015 the aneurysm is now larger. There is a residual small thrombus at the apex of the aneurysm measuring 7 x 5 mm.  2. Normal right ventricular size, thickness and systolic function (RVEF = 56%).  3. Mildly dilated left atrium.  4. Mild mitral and trivial tricuspid regurgitation.  Tobias AlexanderKatarina Normalee Sistare    ASSESSMENT AND PLAN:  69 year old former smoker   1. CAD, s/p CABG and PCI as above, we will continue aspirin 81 mg po daily, we will discontinue plavix as the patient describes significant abdominal pain on combination with ASA/plavix/warfarin Continue metoprolol. He is encouraged to exercise.  2. LV aneurysm/thrombus - repeat MRI shows enlargement of the aneurysm (as above) and residual small thrombus, we will continue warfarin, we will repeat cardiac MRI in 6 months, the last INR 2.8  3. HTN - uncontrolled, we will add lisinopril 5 mg po daily.  4. Hyperlipidemia - well controlled on simvastatin 40 mg po daily, lipids at goal  5. Paroxysmal a-fib - only post op, now in SR, denies  palpitations   6. PAD - weak pulses and claudications the patient wants to just increase his exercise for now, we will postpone LE duplex for now.   Follow up in 6 months, we will repeat cardiac MRI, CMP, lipids. CBC prior to the follow up.    Signed, Lars MassonNELSON, Camelia Stelzner H, MD  10/17/2015 9:04 AM    [facility] [address], [place], [place]  [address] Phone: [phone](336) [phone]; Fax: [phone]) [phone]

## 2015-10-17 NOTE — Patient Instructions (Signed)
Your physician has recommended you make the following change in your medication:  1. START LISINOPRIL 5 MG ONCE DAILY  Your physician wants you to follow-up in: 6 months You will receive a reminder letter in the mail two months in advance. If you don't receive a letter, please call our office to schedule the follow-up appointment.  Your physician has requested that you have a cardiac MRI. Cardiac MRI uses a computer to create images of your heart as its beating, producing both still and moving pictures of your heart and major blood vessels. For further information please visit [email or web address]. Please follow the instruction sheet given to you today for more information.-----PLEASE SCHEDULE IN 6 MONTHS PRIOR TO NEXT APPOINTMENT WITH DR. Delton SeeNELSON.

## 2015-10-23 ENCOUNTER — Other Ambulatory Visit: Payer: Medicare Other

## 2015-10-29 ENCOUNTER — Ambulatory Visit: Payer: Medicare Other | Admitting: Cardiology

## 2015-11-17 ENCOUNTER — Ambulatory Visit: Payer: Medicare Other | Admitting: Cardiology

## 2015-11-17 ENCOUNTER — Other Ambulatory Visit: Payer: Medicare Other

## 2016-02-25 DIAGNOSIS — L821 Other seborrheic keratosis: Secondary | ICD-10-CM | POA: Diagnosis not present

## 2016-02-25 DIAGNOSIS — B352 Tinea manuum: Secondary | ICD-10-CM | POA: Diagnosis not present

## 2016-02-25 DIAGNOSIS — B353 Tinea pedis: Secondary | ICD-10-CM | POA: Diagnosis not present

## 2016-02-25 DIAGNOSIS — Z85828 Personal history of other malignant neoplasm of skin: Secondary | ICD-10-CM | POA: Diagnosis not present

## 2016-02-25 DIAGNOSIS — B351 Tinea unguium: Secondary | ICD-10-CM | POA: Diagnosis not present

## 2016-02-25 DIAGNOSIS — M9510 Cauliflower ear, unspecified ear: Secondary | ICD-10-CM | POA: Diagnosis not present

## 2016-02-25 DIAGNOSIS — L298 Other pruritus: Secondary | ICD-10-CM | POA: Diagnosis not present

## 2016-02-25 DIAGNOSIS — Z872 Personal history of diseases of the skin and subcutaneous tissue: Secondary | ICD-10-CM | POA: Diagnosis not present

## 2016-03-08 ENCOUNTER — Telehealth: Payer: Self-pay | Admitting: Cardiology

## 2016-03-08 DIAGNOSIS — Z951 Presence of aortocoronary bypass graft: Secondary | ICD-10-CM

## 2016-03-08 DIAGNOSIS — I480 Paroxysmal atrial fibrillation: Secondary | ICD-10-CM

## 2016-03-08 DIAGNOSIS — E785 Hyperlipidemia, unspecified: Secondary | ICD-10-CM

## 2016-03-08 DIAGNOSIS — I253 Aneurysm of heart: Secondary | ICD-10-CM

## 2016-03-08 DIAGNOSIS — I25709 Atherosclerosis of coronary artery bypass graft(s), unspecified, with unspecified angina pectoris: Secondary | ICD-10-CM

## 2016-03-08 NOTE — Telephone Encounter (Signed)
Returned call to patient.He stated he wants to have cardiac MRI in April.Advised Dr.Nelson's nurse is out of office.I will send message to her.

## 2016-03-08 NOTE — Telephone Encounter (Signed)
New message   Pt wants an MRI done before seein Dr. Delton SeeNelson

## 2016-03-09 NOTE — Telephone Encounter (Signed)
Below is Dr Joanie CoddingtonNelsons assessment and plan for the pt at last OV on 10/17/15:   1. CAD, s/p CABG and PCI as above, we will continue aspirin 81 mg po daily, we will discontinue plavix as the patient describes significant abdominal pain on combination with ASA/plavix/warfarin Continue metoprolol. He is encouraged to exercise.  2. LV aneurysm/thrombus - repeat MRI shows enlargement of the aneurysm (as above) and residual small thrombus, we will continue warfarin, we will repeat cardiac MRI in 6 months, the last INR 2.8  3. HTN - uncontrolled, we will add lisinopril 5 mg po daily.  4. Hyperlipidemia - well controlled on simvastatin 40 mg po daily, lipids at goal  5. Paroxysmal a-fib - only post op, now in SR, denies palpitations   6. PAD - weak pulses and claudications the patient wants to just increase his exercise for now, we will postpone LE duplex for now.   Follow up in 6 months, we will repeat cardiac MRI, CMP, lipids. CBC prior to the follow up.

## 2016-03-09 NOTE — Telephone Encounter (Signed)
According to Dr Lindaann SloughNelson's last OV note with the pt, he will need a repeat cardiac MRI and CMET, LIPIDS, CBC W DIFF, prior to her 6 month follow-up appt with her in April.  Scheduled the pts follow-up appt with Dr Delton SeeNelson for 04/16/16 at 0900.  Will contact [facility] [place], who schedules cardiac MRI, to have his follow-up cardiac MRI scheduled prior to his appt with Delton SeeNelson on 4/21 and labs prior to his MRI, as protocol and as ordered by Dr Delton SeeNelson at last OV.  Pt aware of follow-up appt date and time and aware that someone will be calling him back to arrange his lab appt, then his cardiac MRI appt, all prior to his 4/21 appt with Dr Delton SeeNelson.  Pt gracious for the prompt follow-up from call placed yesterday.

## 2016-03-11 ENCOUNTER — Encounter: Payer: Self-pay | Admitting: Cardiology

## 2016-03-11 NOTE — Telephone Encounter (Signed)
Follow UP:   Pt says he is still waiting to get his appts for his MRI,lab and whatever else he needs please.

## 2016-03-11 NOTE — Telephone Encounter (Signed)
Notified the pt that his cardiac MRI is scheduled for 4/19 at 11am arrive at 1030am, for Dr Delton SeeNelson to read.  Scheduled his lab appt prior too his cardiac MRI to check fasting lipids, cbc w diff, and cmet for 04/05/16.  Pt aware to come fasting to this lab appt.  Pt verbalized understanding and agrees with plan mentioned above.

## 2016-04-05 ENCOUNTER — Other Ambulatory Visit (INDEPENDENT_AMBULATORY_CARE_PROVIDER_SITE_OTHER): Payer: Medicare Other | Admitting: *Deleted

## 2016-04-05 DIAGNOSIS — Z951 Presence of aortocoronary bypass graft: Secondary | ICD-10-CM

## 2016-04-05 DIAGNOSIS — I25709 Atherosclerosis of coronary artery bypass graft(s), unspecified, with unspecified angina pectoris: Secondary | ICD-10-CM | POA: Diagnosis not present

## 2016-04-05 DIAGNOSIS — I253 Aneurysm of heart: Secondary | ICD-10-CM

## 2016-04-05 DIAGNOSIS — I209 Angina pectoris, unspecified: Secondary | ICD-10-CM | POA: Diagnosis not present

## 2016-04-05 DIAGNOSIS — I480 Paroxysmal atrial fibrillation: Secondary | ICD-10-CM

## 2016-04-05 DIAGNOSIS — I4891 Unspecified atrial fibrillation: Secondary | ICD-10-CM | POA: Diagnosis not present

## 2016-04-05 DIAGNOSIS — E785 Hyperlipidemia, unspecified: Secondary | ICD-10-CM | POA: Diagnosis not present

## 2016-04-05 LAB — CBC WITH DIFFERENTIAL/PLATELET
Basophils Absolute: 81 cells/uL (ref 0–200)
Basophils Relative: 1 %
Eosinophils Absolute: 324 cells/uL (ref 15–500)
Eosinophils Relative: 4 %
HCT: 43.7 % (ref 38.5–50.0)
Hemoglobin: 15.2 g/dL (ref 13.2–17.1)
Lymphocytes Relative: 34 %
Lymphs Abs: 2754 cells/uL (ref 850–3900)
MCH: 31.6 pg (ref 27.0–33.0)
MCHC: 34.8 g/dL (ref 32.0–36.0)
MCV: 90.9 fL (ref 80.0–100.0)
MPV: 9.6 fL (ref 7.5–12.5)
Monocytes Absolute: 567 cells/uL (ref 200–950)
Monocytes Relative: 7 %
Neutro Abs: 4374 cells/uL (ref 1500–7800)
Neutrophils Relative %: 54 %
Platelets: 265 10*3/uL (ref 140–400)
RBC: 4.81 MIL/uL (ref 4.20–5.80)
RDW: 13 % (ref 11.0–15.0)
WBC: 8.1 10*3/uL (ref 3.8–10.8)

## 2016-04-05 LAB — COMPREHENSIVE METABOLIC PANEL
ALT: 8 U/L — ABNORMAL LOW (ref 9–46)
AST: 11 U/L (ref 10–35)
Albumin: 4.5 g/dL (ref 3.6–5.1)
Alkaline Phosphatase: 50 U/L (ref 40–115)
BUN: 16 mg/dL (ref 7–25)
CO2: 26 mmol/L (ref 20–31)
Calcium: 9.5 mg/dL (ref 8.6–10.3)
Chloride: 104 mmol/L (ref 98–110)
Creat: 0.7 mg/dL (ref 0.70–1.18)
Glucose, Bld: 108 mg/dL — ABNORMAL HIGH (ref 65–99)
Potassium: 4.9 mmol/L (ref 3.5–5.3)
Sodium: 142 mmol/L (ref 135–146)
Total Bilirubin: 0.7 mg/dL (ref 0.2–1.2)
Total Protein: 6.7 g/dL (ref 6.1–8.1)

## 2016-04-05 LAB — LIPID PANEL
Cholesterol: 193 mg/dL (ref 125–200)
HDL: 60 mg/dL (ref >=40)
LDL Cholesterol: 100 mg/dL (ref <130)
Total CHOL/HDL Ratio: 3.2 Ratio (ref <=5.0)
Triglycerides: 163 mg/dL — ABNORMAL HIGH (ref <150)
VLDL: 33 mg/dL — ABNORMAL HIGH (ref <30)

## 2016-04-14 ENCOUNTER — Ambulatory Visit (HOSPITAL_COMMUNITY)
Admission: RE | Admit: 2016-04-14 | Discharge: 2016-04-14 | Disposition: A | Discharge status: Home / Self Care | Payer: No Typology Code available for payment source | Source: Ambulatory Visit | Attending: Cardiology | Admitting: Cardiology

## 2016-04-14 DIAGNOSIS — I340 Nonrheumatic mitral (valve) insufficiency: Secondary | ICD-10-CM | POA: Insufficient documentation

## 2016-04-14 DIAGNOSIS — I071 Rheumatic tricuspid insufficiency: Secondary | ICD-10-CM | POA: Diagnosis not present

## 2016-04-14 DIAGNOSIS — I517 Cardiomegaly: Secondary | ICD-10-CM | POA: Insufficient documentation

## 2016-04-14 DIAGNOSIS — E785 Hyperlipidemia, unspecified: Secondary | ICD-10-CM | POA: Insufficient documentation

## 2016-04-14 DIAGNOSIS — Z951 Presence of aortocoronary bypass graft: Secondary | ICD-10-CM | POA: Insufficient documentation

## 2016-04-14 DIAGNOSIS — I25709 Atherosclerosis of coronary artery bypass graft(s), unspecified, with unspecified angina pectoris: Secondary | ICD-10-CM | POA: Insufficient documentation

## 2016-04-14 DIAGNOSIS — I480 Paroxysmal atrial fibrillation: Secondary | ICD-10-CM | POA: Diagnosis not present

## 2016-04-14 DIAGNOSIS — I253 Aneurysm of heart: Secondary | ICD-10-CM | POA: Diagnosis not present

## 2016-04-14 MED ORDER — GADOBENATE DIMEGLUMINE 529 MG/ML IV SOLN
25.0000 mL | Freq: Once | INTRAVENOUS | Status: AC | PRN
  Administered 2016-04-14: 25 mL via INTRAVENOUS

## 2016-04-16 ENCOUNTER — Ambulatory Visit (INDEPENDENT_AMBULATORY_CARE_PROVIDER_SITE_OTHER): Payer: Medicare Other | Admitting: Cardiology

## 2016-04-16 ENCOUNTER — Encounter: Payer: Self-pay | Admitting: Cardiology

## 2016-04-16 VITALS — BP 160/90 | HR 79 | Ht 66.0 in | Wt 176.4 lb

## 2016-04-16 DIAGNOSIS — I25709 Atherosclerosis of coronary artery bypass graft(s), unspecified, with unspecified angina pectoris: Secondary | ICD-10-CM

## 2016-04-16 DIAGNOSIS — I480 Paroxysmal atrial fibrillation: Secondary | ICD-10-CM

## 2016-04-16 DIAGNOSIS — Z951 Presence of aortocoronary bypass graft: Secondary | ICD-10-CM | POA: Diagnosis not present

## 2016-04-16 DIAGNOSIS — I739 Peripheral vascular disease, unspecified: Secondary | ICD-10-CM | POA: Diagnosis not present

## 2016-04-16 DIAGNOSIS — I513 Intracardiac thrombosis, not elsewhere classified: Secondary | ICD-10-CM

## 2016-04-16 DIAGNOSIS — E785 Hyperlipidemia, unspecified: Secondary | ICD-10-CM

## 2016-04-16 DIAGNOSIS — Z79899 Other long term (current) drug therapy: Secondary | ICD-10-CM

## 2016-04-16 DIAGNOSIS — I213 ST elevation (STEMI) myocardial infarction of unspecified site: Secondary | ICD-10-CM | POA: Diagnosis not present

## 2016-04-16 DIAGNOSIS — I253 Aneurysm of heart: Secondary | ICD-10-CM | POA: Diagnosis not present

## 2016-04-16 DIAGNOSIS — Z7901 Long term (current) use of anticoagulants: Secondary | ICD-10-CM

## 2016-04-16 NOTE — Patient Instructions (Signed)
Medication Instructions:  1) STOP Warfarin  Labwork: None  Testing/Procedures: Your physician has requested that you have a lower extremity arterial duplex. This test is an ultrasound of the arteries in the legs or arms. It looks at arterial blood flow in the legs and arms. Allow one hour for Lower and Upper Arterial scans. There are no restrictions or special instructions    Follow-Up: Your physician wants you to follow-up in: 6 month with Dr. Delton SeeNelson. You will receive a reminder letter in the mail two months in advance. If you don't receive a letter, please call our office to schedule the follow-up appointment.   Any Other Special Instructions Will Be Listed Below (If Applicable).     If you need a refill on your cardiac medications before your next appointment, please call your pharmacy.

## 2016-04-16 NOTE — Progress Notes (Signed)
Patient ID: Tanner Hansen, male   DOB: 09-27-1946, 70 y.o.   MRN: [id number]      Cardiology Office Note  Date:  04/16/2016   ID:  Tanner Hansen, DOB 09-27-1946, MRN [id number]  PCP:  No PCP Per Patient  Cardiologist:Caitlin Ainley, Faustino CongressKATARINA H, MD   Chief compliant: Re-establish cardiology care  History of Present Illness: Tanner RingerGary Hansen is a 70 y.o. male, former smoker with PMH od CAD, s/p MI in 2013, s/p CABG with LIMA/LAD, SVG/D1, SVG/PDA (distal LCX) with atypical left cheek twitching, mild fatigue, followed by PCI to distal LCX because of failed SVG to PDA in 2014. All his work up and treatment has [facility], he now wants to switch to our care. 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 He was started on Warfarin. The patient is moderately active, mostly in his back yard and denies and chest pain, DOE, orthopnea, LE edema, palpitations or syncope.  He states that both of his calves get tired while walking.   04/16/2016 - the patient is coming after 6 months, he denies any weakness, compliant with his Coumadin and has no bleeding complications. He is compliant to all his other medicines without any significant side effects. He denies any chest pain or shortness of breath. He is able to work full time. He's complaining are claudication after walking short distances. He recently underwent carotid ultrasound for retinal hemorrhage and they showed only mild atherosclerotic plaque bilaterally. He denies any palpitations or syncope.  Past Medical History  Diagnosis Date  . CAD (coronary artery disease)   . DDD (degenerative disc disease), lumbar   . A-fib (HCC)   . Psoriasis    Past Surgical History  Procedure Laterality Date  . Coronary artery bypass graft  2013  . Cardiac catheterization     Current Outpatient Prescriptions  Medication Sig Dispense Refill   . aspirin 81 MG tablet Take 81 mg by mouth daily.    . Calcium Carbonate-Vitamin D (CALCIUM + D PO) Take 500 mg by mouth daily.    Marland Kitchen. lisinopril (PRINIVIL,ZESTRIL) 5 MG tablet Take 1 tablet (5 mg total) by mouth daily. 30 tablet 11  . metoprolol tartrate (LOPRESSOR) 25 MG tablet Take 25 mg by mouth 2 (two) times daily.    . nitroGLYCERIN (NITROSTAT) 0.4 MG SL tablet Place 0.4 mg under the tongue every 5 (five) minutes as needed for chest pain (x 3 doses).     . pantoprazole (PROTONIX) 40 MG tablet Take 40 mg by mouth daily.    . simvastatin (ZOCOR) 80 MG tablet Take 80 mg by mouth daily. TAKE ONE-HALF TABLET BY MOUTH DAILY AT BED TIME    . warfarin (COUMADIN) 5 MG tablet Take 7.5 mg by mouth daily at 6 PM.      No current facility-administered medications for this visit.    Allergies:   Codeine; Other; Oxycodone-acetaminophen; and Vicodin    Social History:  The patient  reports that he has quit smoking. He does not have any smokeless tobacco history on file. He reports that he does not drink alcohol or use illicit drugs.   Family History:  The patient's family history includes Cancer in his father; Heart attack (age of onset: 7570) in his father; Heart disease in his father; Hypertension in his mother.   ROS:  Please see the history  of present illness.   Otherwise, review of systems are positive for none.   All other systems are reviewed and negative.   PHYSICAL EXAM: VS:  BP 160/90 mmHg  Pulse 79  Ht  (1.676 m)  Wt 176 lb 6.4 oz (80.015 kg)  BMI 28.49 kg/m2 , BMI Body mass index is 28.49 kg/(m^2). GEN: Well nourished, well developed, in no acute distress HEENT: normal Neck: no JVD, carotid bruits, or masses Cardiac: RRR;2/6 systolic murmur, rubs, or gallops,no edema  Respiratory:  clear to auscultation bilaterally, normal work of breathing GI: soft, nontender, nondistended, + BS MS: no deformity or atrophy Skin: warm and dry, no rash Neuro:  Strength and sensation are  intact Psych: euthymic mood, full affect  EKG:  EKG is ordered today. The ekg ordered today demonstrates SR, inferior MI, age undetermined  Recent Labs: 04/05/2016: ALT 8*; BUN 16; Creat 0.70; Hemoglobin 15.2; Platelets 265; Potassium 4.9; Sodium 142   Lipid Panel    Component Value Date/Time   CHOL 193 04/05/2016 0738   TRIG 163* 04/05/2016 0738   HDL 60 04/05/2016 0738   CHOLHDL 3.2 04/05/2016 0738   VLDL 33* 04/05/2016 0738   LDLCALC 100 04/05/2016 0738   Wt Readings from Last 3 Encounters:  04/16/16 176 lb 6.4 oz (80.015 kg)  10/17/15 172 lb (78.019 kg)  05/09/15 161 lb (73.029 kg)    Other studies Reviewed: Additional studies/ records that were reviewed today include: extensive records from [facility] including a cath, CABG, chest CT, echocardiogram, cardiac MRI, I have personally reviewed cardiac MRI CD Review of the above records demonstrates: as described in HPI  Cardiac MRI 03/04/2015 [facility]: LVEF 59%, 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  Cardiac MRI 10/03/2015 1. Normal left ventricular size, with mild basal septal hypertrophy and low normal systolic function (LVEF 53%). There is mid inferior wall aneurysm with dyskinesis. The aneurysm measures 34 x 24 mm at its neck and is 26 mm deep.  When compared to the prior report from 03/07/2015 the aneurysm is now larger. There is a residual small thrombus at the apex of the aneurysm measuring 7 x 5 mm.  2. Normal right ventricular size, thickness and systolic function (RVEF = 56%).  3. Mildly dilated left atrium.  4. Mild mitral and trivial tricuspid regurgitation.  Tobias Alexander    ASSESSMENT AND PLAN:  18- year old former smoker   1. CAD, s/p CABG and PCI as above, we will continue aspirin 81 mg po daily, metoprolol, simvastatin. He is encouraged to exercise.  2. LV aneurysm/thrombus - repeat MRI done on April 19 shows no  residual thrombus, patient is very eager to discontinue warfarin, we will do so and increase aspirin to 325 mg daily. Aneurysm size has been stable in size. From now or will repeat MRIs once per year.  3. HTN - controlled after adding lisinopril a year ago..  4. Hyperlipidemia - on simvastatin 40 mg po daily, LDL and HDL at goal, triglycerides slightly elevated.he is advised on cutting down sugar intake.  5. Paroxysmal a-fib - only post op, now in SR, denies palpitations   6. PAD - weak pulses and claudications, this is a chronic problem however in the past patient declined peripheral arterial studies he is agreeable now we will order arterial duplex bilaterally.  Follow up in 6 months.   Signed, Lars Masson, MD  04/16/2016 9:39 AM  [facility] [place], [place], [place]  [address] Phone: [phone]; Fax: [phone]

## 2016-04-23 ENCOUNTER — Encounter (HOSPITAL_COMMUNITY): Payer: Medicare Other

## 2016-04-27 ENCOUNTER — Telehealth: Payer: Self-pay | Admitting: Cardiology

## 2016-04-27 MED ORDER — ASPIRIN EC 325 MG PO TBEC: 325.0000 mg | DELAYED_RELEASE_TABLET | Freq: Every day | ORAL | Status: AC

## 2016-04-27 NOTE — Telephone Encounter (Signed)
Pt calling to request a copy of his Cardiac MRI results to be mailed to his current mailing address, as confirmed on file.  Pt states that Dr Delton SeeNelson went over his Cardiac MRI results and recommendations at his 4/21 OV, and she stated she would mail him a copy of this.  Saw Dr Lindaann SloughNelson's interpretation and recommendations of this pts Cardiac MRI results and she recommended that he stop Warfarin and increase his ASA to 325 mg po daily.  Pts Warfarin was discontinued, but his ASA states he is only taking 81 mg po daily.  Pt confirmed that he is actually taking ASA 325 mg po daily, so I informed him that I will make this switch of dose in his med list.  Pt verbalized understanding and agrees with this plan. Informed the pt that I will need to send his request for a copy of his Cardiac MRI results from 04/14/16 to our Medical Records Rep Selena BattenKim, to call him back to verify his mailing address, and send requested copy to him.  Pt verbalized understanding and agrees with this plan.     Cardiac MRI results per Dr Delton SeeNelson from 04/14/16:  LV aneurysm/thrombus - repeat MRI done on April 19 shows no residual thrombus, patient is very eager to discontinue warfarin, we will do so and increase aspirin to 325 mg daily. Aneurysm size has been stable in size. From now or will repeat MRIs once per year.

## 2016-04-27 NOTE — Telephone Encounter (Signed)
New Message:  Pt called in wanting to get the results to his MRI that was done on 4/19 mailed to his home . His address it still current. Please f/u with pt

## 2016-09-28 DIAGNOSIS — Z885 Allergy status to narcotic agent status: Secondary | ICD-10-CM | POA: Diagnosis not present

## 2016-09-28 DIAGNOSIS — N201 Calculus of ureter: Secondary | ICD-10-CM | POA: Diagnosis not present

## 2016-09-28 DIAGNOSIS — Z955 Presence of coronary angioplasty implant and graft: Secondary | ICD-10-CM | POA: Diagnosis not present

## 2016-09-28 DIAGNOSIS — N202 Calculus of kidney with calculus of ureter: Secondary | ICD-10-CM | POA: Diagnosis not present

## 2016-09-28 DIAGNOSIS — R109 Unspecified abdominal pain: Secondary | ICD-10-CM | POA: Diagnosis not present

## 2016-09-28 DIAGNOSIS — Z79899 Other long term (current) drug therapy: Secondary | ICD-10-CM | POA: Diagnosis not present

## 2016-09-28 DIAGNOSIS — Z888 Allergy status to other drugs, medicaments and biological substances status: Secondary | ICD-10-CM | POA: Diagnosis not present

## 2016-09-28 DIAGNOSIS — S3993XA Unspecified injury of pelvis, initial encounter: Secondary | ICD-10-CM | POA: Diagnosis not present

## 2016-09-28 DIAGNOSIS — I700 Atherosclerosis of aorta: Secondary | ICD-10-CM | POA: Diagnosis not present

## 2016-09-28 DIAGNOSIS — I10 Essential (primary) hypertension: Secondary | ICD-10-CM | POA: Diagnosis not present

## 2016-09-28 DIAGNOSIS — G629 Polyneuropathy, unspecified: Secondary | ICD-10-CM | POA: Diagnosis not present

## 2016-09-28 DIAGNOSIS — K5730 Diverticulosis of large intestine without perforation or abscess without bleeding: Secondary | ICD-10-CM | POA: Diagnosis not present

## 2016-09-28 DIAGNOSIS — R1030 Lower abdominal pain, unspecified: Secondary | ICD-10-CM | POA: Diagnosis not present

## 2016-09-28 DIAGNOSIS — Z87891 Personal history of nicotine dependence: Secondary | ICD-10-CM | POA: Diagnosis not present

## 2016-09-28 DIAGNOSIS — S3991XA Unspecified injury of abdomen, initial encounter: Secondary | ICD-10-CM | POA: Diagnosis not present

## 2016-09-28 DIAGNOSIS — Z7982 Long term (current) use of aspirin: Secondary | ICD-10-CM | POA: Diagnosis not present

## 2016-10-09 DIAGNOSIS — M791 Myalgia: Secondary | ICD-10-CM | POA: Diagnosis not present

## 2016-10-09 DIAGNOSIS — M545 Low back pain: Secondary | ICD-10-CM | POA: Diagnosis not present

## 2016-10-09 DIAGNOSIS — M9905 Segmental and somatic dysfunction of pelvic region: Secondary | ICD-10-CM | POA: Diagnosis not present

## 2016-10-09 DIAGNOSIS — M9903 Segmental and somatic dysfunction of lumbar region: Secondary | ICD-10-CM | POA: Diagnosis not present

## 2016-10-11 DIAGNOSIS — M9903 Segmental and somatic dysfunction of lumbar region: Secondary | ICD-10-CM | POA: Diagnosis not present

## 2016-10-11 DIAGNOSIS — M791 Myalgia: Secondary | ICD-10-CM | POA: Diagnosis not present

## 2016-10-11 DIAGNOSIS — M9905 Segmental and somatic dysfunction of pelvic region: Secondary | ICD-10-CM | POA: Diagnosis not present

## 2016-10-11 DIAGNOSIS — M545 Low back pain: Secondary | ICD-10-CM | POA: Diagnosis not present

## 2016-10-14 DIAGNOSIS — M545 Low back pain: Secondary | ICD-10-CM | POA: Diagnosis not present

## 2016-10-14 DIAGNOSIS — M791 Myalgia: Secondary | ICD-10-CM | POA: Diagnosis not present

## 2016-10-14 DIAGNOSIS — M9903 Segmental and somatic dysfunction of lumbar region: Secondary | ICD-10-CM | POA: Diagnosis not present

## 2016-10-14 DIAGNOSIS — M9905 Segmental and somatic dysfunction of pelvic region: Secondary | ICD-10-CM | POA: Diagnosis not present

## 2016-10-18 DIAGNOSIS — M791 Myalgia: Secondary | ICD-10-CM | POA: Diagnosis not present

## 2016-10-18 DIAGNOSIS — M545 Low back pain: Secondary | ICD-10-CM | POA: Diagnosis not present

## 2016-10-18 DIAGNOSIS — M9905 Segmental and somatic dysfunction of pelvic region: Secondary | ICD-10-CM | POA: Diagnosis not present

## 2016-10-18 DIAGNOSIS — M9903 Segmental and somatic dysfunction of lumbar region: Secondary | ICD-10-CM | POA: Diagnosis not present

## 2016-10-20 DIAGNOSIS — R310 Gross hematuria: Secondary | ICD-10-CM | POA: Diagnosis not present

## 2016-10-20 DIAGNOSIS — N200 Calculus of kidney: Secondary | ICD-10-CM | POA: Diagnosis not present

## 2016-10-21 DIAGNOSIS — M9905 Segmental and somatic dysfunction of pelvic region: Secondary | ICD-10-CM | POA: Diagnosis not present

## 2016-10-21 DIAGNOSIS — M545 Low back pain: Secondary | ICD-10-CM | POA: Diagnosis not present

## 2016-10-21 DIAGNOSIS — M791 Myalgia: Secondary | ICD-10-CM | POA: Diagnosis not present

## 2016-10-21 DIAGNOSIS — M9903 Segmental and somatic dysfunction of lumbar region: Secondary | ICD-10-CM | POA: Diagnosis not present

## 2016-10-25 DIAGNOSIS — M545 Low back pain: Secondary | ICD-10-CM | POA: Diagnosis not present

## 2016-10-25 DIAGNOSIS — M9905 Segmental and somatic dysfunction of pelvic region: Secondary | ICD-10-CM | POA: Diagnosis not present

## 2016-10-25 DIAGNOSIS — M791 Myalgia: Secondary | ICD-10-CM | POA: Diagnosis not present

## 2016-10-25 DIAGNOSIS — M9903 Segmental and somatic dysfunction of lumbar region: Secondary | ICD-10-CM | POA: Diagnosis not present

## 2016-10-30 DIAGNOSIS — M9905 Segmental and somatic dysfunction of pelvic region: Secondary | ICD-10-CM | POA: Diagnosis not present

## 2016-10-30 DIAGNOSIS — M791 Myalgia: Secondary | ICD-10-CM | POA: Diagnosis not present

## 2016-10-30 DIAGNOSIS — M9903 Segmental and somatic dysfunction of lumbar region: Secondary | ICD-10-CM | POA: Diagnosis not present

## 2016-10-30 DIAGNOSIS — M545 Low back pain: Secondary | ICD-10-CM | POA: Diagnosis not present

## 2016-11-01 DIAGNOSIS — I480 Paroxysmal atrial fibrillation: Secondary | ICD-10-CM | POA: Diagnosis not present

## 2016-11-01 DIAGNOSIS — Z9889 Other specified postprocedural states: Secondary | ICD-10-CM | POA: Diagnosis not present

## 2016-11-01 DIAGNOSIS — Z951 Presence of aortocoronary bypass graft: Secondary | ICD-10-CM | POA: Diagnosis not present

## 2016-11-01 DIAGNOSIS — I2510 Atherosclerotic heart disease of native coronary artery without angina pectoris: Secondary | ICD-10-CM | POA: Diagnosis not present

## 2016-11-03 DIAGNOSIS — N200 Calculus of kidney: Secondary | ICD-10-CM | POA: Diagnosis not present

## 2016-11-06 DIAGNOSIS — M9905 Segmental and somatic dysfunction of pelvic region: Secondary | ICD-10-CM | POA: Diagnosis not present

## 2016-11-06 DIAGNOSIS — M791 Myalgia: Secondary | ICD-10-CM | POA: Diagnosis not present

## 2016-11-06 DIAGNOSIS — M9903 Segmental and somatic dysfunction of lumbar region: Secondary | ICD-10-CM | POA: Diagnosis not present

## 2016-11-06 DIAGNOSIS — M545 Low back pain: Secondary | ICD-10-CM | POA: Diagnosis not present

## 2016-11-08 DIAGNOSIS — R310 Gross hematuria: Secondary | ICD-10-CM | POA: Diagnosis not present

## 2016-11-08 DIAGNOSIS — N200 Calculus of kidney: Secondary | ICD-10-CM | POA: Diagnosis not present

## 2016-11-11 DIAGNOSIS — I480 Paroxysmal atrial fibrillation: Secondary | ICD-10-CM | POA: Diagnosis not present

## 2016-11-11 DIAGNOSIS — I2510 Atherosclerotic heart disease of native coronary artery without angina pectoris: Secondary | ICD-10-CM | POA: Diagnosis not present

## 2016-11-13 DIAGNOSIS — M791 Myalgia: Secondary | ICD-10-CM | POA: Diagnosis not present

## 2016-11-13 DIAGNOSIS — M9903 Segmental and somatic dysfunction of lumbar region: Secondary | ICD-10-CM | POA: Diagnosis not present

## 2016-11-13 DIAGNOSIS — M545 Low back pain: Secondary | ICD-10-CM | POA: Diagnosis not present

## 2016-11-13 DIAGNOSIS — M9905 Segmental and somatic dysfunction of pelvic region: Secondary | ICD-10-CM | POA: Diagnosis not present

## 2016-11-27 DIAGNOSIS — M545 Low back pain: Secondary | ICD-10-CM | POA: Diagnosis not present

## 2016-11-27 DIAGNOSIS — M791 Myalgia: Secondary | ICD-10-CM | POA: Diagnosis not present

## 2016-11-27 DIAGNOSIS — M9905 Segmental and somatic dysfunction of pelvic region: Secondary | ICD-10-CM | POA: Diagnosis not present

## 2016-11-27 DIAGNOSIS — M9903 Segmental and somatic dysfunction of lumbar region: Secondary | ICD-10-CM | POA: Diagnosis not present

## 2016-12-04 DIAGNOSIS — M7661 Achilles tendinitis, right leg: Secondary | ICD-10-CM | POA: Diagnosis not present

## 2016-12-04 DIAGNOSIS — M9905 Segmental and somatic dysfunction of pelvic region: Secondary | ICD-10-CM | POA: Diagnosis not present

## 2016-12-04 DIAGNOSIS — M545 Low back pain: Secondary | ICD-10-CM | POA: Diagnosis not present

## 2016-12-04 DIAGNOSIS — M9903 Segmental and somatic dysfunction of lumbar region: Secondary | ICD-10-CM | POA: Diagnosis not present

## 2016-12-04 DIAGNOSIS — M791 Myalgia: Secondary | ICD-10-CM | POA: Diagnosis not present

## 2016-12-11 DIAGNOSIS — M545 Low back pain: Secondary | ICD-10-CM | POA: Diagnosis not present

## 2016-12-11 DIAGNOSIS — M9905 Segmental and somatic dysfunction of pelvic region: Secondary | ICD-10-CM | POA: Diagnosis not present

## 2016-12-11 DIAGNOSIS — M7661 Achilles tendinitis, right leg: Secondary | ICD-10-CM | POA: Diagnosis not present

## 2016-12-11 DIAGNOSIS — M791 Myalgia: Secondary | ICD-10-CM | POA: Diagnosis not present

## 2016-12-11 DIAGNOSIS — M9903 Segmental and somatic dysfunction of lumbar region: Secondary | ICD-10-CM | POA: Diagnosis not present

## 2016-12-17 DIAGNOSIS — M7661 Achilles tendinitis, right leg: Secondary | ICD-10-CM | POA: Diagnosis not present

## 2016-12-17 DIAGNOSIS — M791 Myalgia: Secondary | ICD-10-CM | POA: Diagnosis not present

## 2016-12-17 DIAGNOSIS — M9903 Segmental and somatic dysfunction of lumbar region: Secondary | ICD-10-CM | POA: Diagnosis not present

## 2016-12-17 DIAGNOSIS — M545 Low back pain: Secondary | ICD-10-CM | POA: Diagnosis not present

## 2016-12-17 DIAGNOSIS — M9905 Segmental and somatic dysfunction of pelvic region: Secondary | ICD-10-CM | POA: Diagnosis not present

## 2016-12-22 DIAGNOSIS — M79661 Pain in right lower leg: Secondary | ICD-10-CM | POA: Diagnosis not present

## 2016-12-22 DIAGNOSIS — M79662 Pain in left lower leg: Secondary | ICD-10-CM | POA: Diagnosis not present

## 2016-12-24 DIAGNOSIS — M7661 Achilles tendinitis, right leg: Secondary | ICD-10-CM | POA: Diagnosis not present

## 2016-12-24 DIAGNOSIS — M9903 Segmental and somatic dysfunction of lumbar region: Secondary | ICD-10-CM | POA: Diagnosis not present

## 2016-12-24 DIAGNOSIS — M9905 Segmental and somatic dysfunction of pelvic region: Secondary | ICD-10-CM | POA: Diagnosis not present

## 2016-12-24 DIAGNOSIS — M545 Low back pain: Secondary | ICD-10-CM | POA: Diagnosis not present

## 2016-12-24 DIAGNOSIS — M791 Myalgia: Secondary | ICD-10-CM | POA: Diagnosis not present

## 2017-06-08 IMAGING — MR MR CARD MORPHOLOGY WO/W CM
11 of 12 series · 15 of 16 positions shown · IV contrast (multihance)
Comparison: Cardiac MRI on 10/03/2015

CLINICAL DATA: 69-year-old male with h/o CAD, inferior wall
aneurysm and thrombus. Re-evaluate aneurysmal size and thrombus
presence.

EXAM:
CARDIAC MRI
TECHNIQUE: The patient was scanned on a 1.5 Tesla GE magnet. A dedicated
cardiac coil was used. Functional imaging was done using Fiesta
sequences. [DATE], and 4 chamber views were done to assess for RWMA's.
Modified Azazga rule using a short axis stack was used to
calculate an ejection fraction on a dedicated work station using
Circle software. The patient received 25 cc of Multihance. After 10
minutes inversion recovery sequences were used to assess for
infiltration and scar tissue.
CONTRAST:  25 cc  of Multihance

[Series 3: bSSFP · sagittal · 8.0mm · 1.52mm/px · 1 of 17 slices shown (1 of 5)]
[im 1/17]
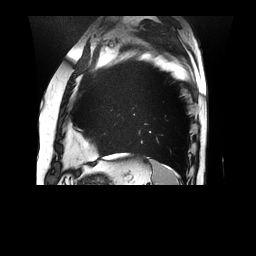

[Series 4: bSSFP · axial · 8.0mm · 1.48mm/px · 1 of 20 slices shown (2 of 5)]
[im 1/20]
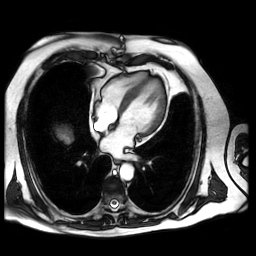

[Series 5: bSSFP · oblique · 8.0mm · 1.41mm/px · 5 of 300 slices shown (3 of 5)]
[im 1/300]
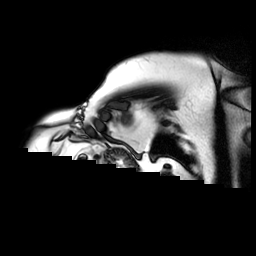
[im 75/300]
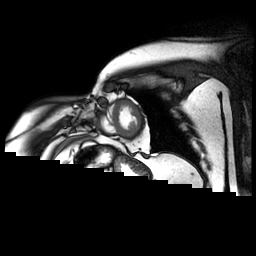
[im 150/300]
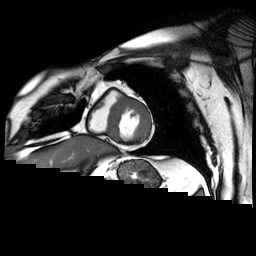
[im 225/300]
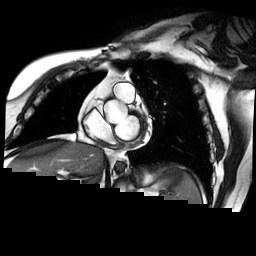
[im 300/300]
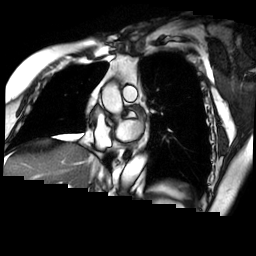

[Series 6: bSSFP · oblique · 8.0mm · 1.45mm/px · 1 of 140 slices shown (4 of 5)]
[im 1/140]
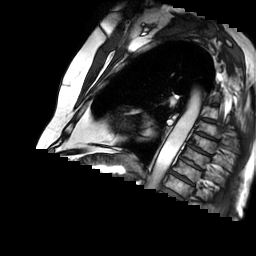

[Series 7: bSSFP · oblique · 8.0mm · 1.21mm/px · 1 of 60 slices shown (5 of 5)]
[im 1/60]
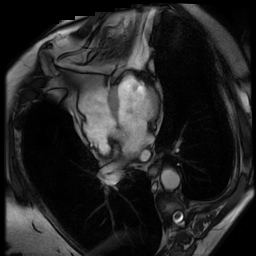

[Series 9: T2 · oblique · 8.0mm · 1.45mm/px · 1 of 60 slices shown (1 of 2)]
[im 1/60]
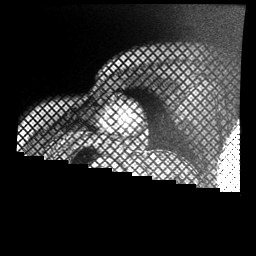

[Series 10: T2 · oblique · 8.0mm · 1.48mm/px · 1 of 60 slices shown (2 of 2)]
[im 1/60]
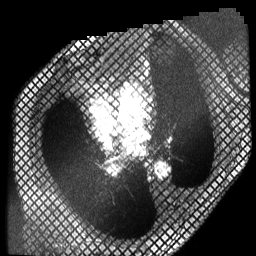

[Series 11: cine ir · oblique · 8.0mm · 1.37mm/px · 1 of 30 slices shown]
[im 1/30]
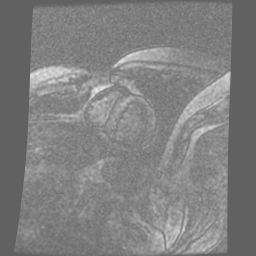

[Series 14: delayed ir prep · oblique · 8.0mm · 1.48mm/px · 1 of 12 slices shown]
[im 1/12]
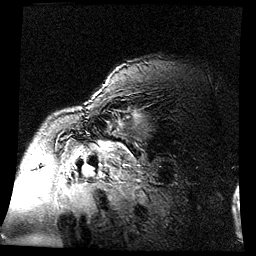

[Series 15: rad delayed ir · axial · 8.0mm · 1.48mm/px · 1 of 3 slices shown]
[im 1/3]
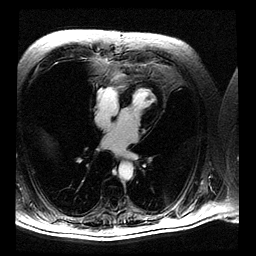

[Series 16: long ti mde · oblique · 8.0mm · 1.48mm/px · 1 of 5 slices shown]
[im 1/5]
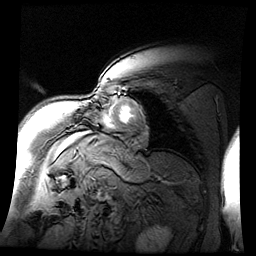

[15 of 16 positions shown; findings below may reference images not displayed]

FINDINGS: 1. Normal left ventricular size, with mild basal septal hypertrophy
and low normal systolic function (LVEF 50%).

There is mid inferior wall aneurysm with dyskinesis. The aneurysm
measures 29 x 22 mm at its neck and is 21 mm deep. The thrombus that
was seen on previous study is no longer present.

2. Normal right ventricular size, thickness and systolic function
(RVEF = 53%).

3. Mildly dilated left atrium (42 mm).

4. Mild mitral and tricuspid regurgitation.

5. There is transmural late gadolinium enhancement in the aneurysmal
wall in the mid inferior wall. All other LV segments have no late
gadolinium enhancement.

6. Aortic root, ascending aorta and pulmonary artery have normal
size.
IMPRESSION: 1. Normal left ventricular size, with mild basal septal hypertrophy
and low normal systolic function (LVEF 50%). There is mid inferior
wall aneurysm with dyskinesis. The aneurysm measures 29 x 22 mm at
its neck and is 21 mm deep. The thrombus that was seen on previous
study is no longer present.

When compared to the prior report from 03/07/2015 the aneurysmal size
is stable, thrombus has resolved.

2. Normal right ventricular size, thickness and systolic function
(RVEF = 53%).

3. Mildly dilated left atrium (42 mm).

4. Mild mitral and tricuspid regurgitation.

[facility]
